# Patient Record
Sex: Female | Born: 1980 | Race: White | Hispanic: No | Smoking: Current every day smoker
Health system: Southern US, Community
[De-identification: ages and names within clinical notes are randomized; demographics above are authoritative.]

## PROBLEM LIST (undated history)

## (undated) DIAGNOSIS — F329 Major depressive disorder, single episode, unspecified: Secondary | ICD-10-CM

## (undated) DIAGNOSIS — F909 Attention-deficit hyperactivity disorder, unspecified type: Secondary | ICD-10-CM

## (undated) DIAGNOSIS — F419 Anxiety disorder, unspecified: Secondary | ICD-10-CM

## (undated) DIAGNOSIS — F4310 Post-traumatic stress disorder, unspecified: Secondary | ICD-10-CM

## (undated) DIAGNOSIS — G43909 Migraine, unspecified, not intractable, without status migrainosus: Secondary | ICD-10-CM

## (undated) DIAGNOSIS — F32A Depression, unspecified: Secondary | ICD-10-CM

## (undated) HISTORY — DX: Attention-deficit hyperactivity disorder, unspecified type: F90.9

## (undated) HISTORY — DX: Post-traumatic stress disorder, unspecified: F43.10

## (undated) HISTORY — DX: Major depressive disorder, single episode, unspecified: F32.9

## (undated) HISTORY — DX: Anxiety disorder, unspecified: F41.9

## (undated) HISTORY — DX: Depression, unspecified: F32.A

## (undated) HISTORY — DX: Migraine, unspecified, not intractable, without status migrainosus: G43.909

---
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
## 2007-09-25 ENCOUNTER — Ambulatory Visit: Payer: Self-pay | Admitting: Family Medicine

## 2007-09-25 ENCOUNTER — Other Ambulatory Visit: Admission: RE | Admit: 2007-09-25 | Discharge: 2007-09-25 | Payer: Self-pay | Admitting: Family Medicine

## 2007-09-25 ENCOUNTER — Encounter: Payer: Self-pay | Admitting: Family Medicine

## 2007-09-25 DIAGNOSIS — D509 Iron deficiency anemia, unspecified: Secondary | ICD-10-CM

## 2007-09-25 HISTORY — DX: Iron deficiency anemia, unspecified: D50.9

## 2007-09-30 LAB — CONVERTED CEMR LAB: Pap Smear: NORMAL

## 2011-05-14 DIAGNOSIS — F319 Bipolar disorder, unspecified: Secondary | ICD-10-CM

## 2011-05-14 HISTORY — DX: Bipolar disorder, unspecified: F31.9

## 2018-09-12 DIAGNOSIS — H5213 Myopia, bilateral: Secondary | ICD-10-CM | POA: Diagnosis not present

## 2018-10-16 DIAGNOSIS — Z1151 Encounter for screening for human papillomavirus (HPV): Secondary | ICD-10-CM | POA: Diagnosis not present

## 2018-10-16 DIAGNOSIS — Z113 Encounter for screening for infections with a predominantly sexual mode of transmission: Secondary | ICD-10-CM | POA: Diagnosis not present

## 2018-10-16 DIAGNOSIS — Z130 Encounter for screening for diseases of the blood and blood-forming organs and certain disorders involving the immune mechanism: Secondary | ICD-10-CM | POA: Diagnosis not present

## 2018-10-16 DIAGNOSIS — Z01419 Encounter for gynecological examination (general) (routine) without abnormal findings: Secondary | ICD-10-CM | POA: Diagnosis not present

## 2018-10-16 DIAGNOSIS — N898 Other specified noninflammatory disorders of vagina: Secondary | ICD-10-CM | POA: Diagnosis not present

## 2018-10-16 DIAGNOSIS — Z124 Encounter for screening for malignant neoplasm of cervix: Secondary | ICD-10-CM | POA: Diagnosis not present

## 2018-12-17 DIAGNOSIS — F902 Attention-deficit hyperactivity disorder, combined type: Secondary | ICD-10-CM | POA: Diagnosis not present

## 2018-12-17 DIAGNOSIS — F411 Generalized anxiety disorder: Secondary | ICD-10-CM | POA: Diagnosis not present

## 2018-12-17 DIAGNOSIS — F4310 Post-traumatic stress disorder, unspecified: Secondary | ICD-10-CM | POA: Diagnosis not present

## 2018-12-17 DIAGNOSIS — F319 Bipolar disorder, unspecified: Secondary | ICD-10-CM | POA: Diagnosis not present

## 2018-12-22 ENCOUNTER — Encounter: Payer: Self-pay | Admitting: Family Medicine

## 2018-12-22 ENCOUNTER — Ambulatory Visit (INDEPENDENT_AMBULATORY_CARE_PROVIDER_SITE_OTHER): Payer: BLUE CROSS/BLUE SHIELD | Admitting: Family Medicine

## 2018-12-22 VITALS — BP 108/60 | HR 89 | Temp 98.8°F | Resp 16 | Ht 62.0 in | Wt 119.4 lb

## 2018-12-22 DIAGNOSIS — R3915 Urgency of urination: Secondary | ICD-10-CM

## 2018-12-22 DIAGNOSIS — N3090 Cystitis, unspecified without hematuria: Secondary | ICD-10-CM

## 2018-12-22 HISTORY — DX: Cystitis, unspecified without hematuria: N30.90

## 2018-12-22 LAB — POCT URINALYSIS DIPSTICK
Bilirubin, UA: NEGATIVE
Glucose, UA: NEGATIVE
Ketones, UA: NEGATIVE
Nitrite, UA: NEGATIVE
PH UA: 6 (ref 5.0–8.0)
Protein, UA: NEGATIVE
Spec Grav, UA: 1.015 (ref 1.010–1.025)
UROBILINOGEN UA: 0.2 U/dL

## 2018-12-22 MED ORDER — NITROFURANTOIN MONOHYD MACRO 100 MG PO CAPS: 100.0000 mg | ORAL_CAPSULE | Freq: Two times a day (BID) | ORAL | 0 refills | Status: DC

## 2018-12-22 NOTE — Assessment & Plan Note (Signed)
-  Will cover empirically with macrobid -Increase fluids -Urine sent for culture and will update antibiotics if needed based on results.

## 2018-12-22 NOTE — Patient Instructions (Signed)
Urinary Tract Infection, Adult A urinary tract infection (UTI) is an infection of any part of the urinary tract. The urinary tract includes:  The kidneys.  The ureters.  The bladder.  The urethra. These organs make, store, and get rid of pee (urine) in the body. What are the causes? This is caused by germs (bacteria) in your genital area. These germs grow and cause swelling (inflammation) of your urinary tract. What increases the risk? You are more likely to develop this condition if:  You have a small, thin tube (catheter) to drain pee.  You cannot control when you pee or poop (incontinence).  You are female, and: ? You use these methods to prevent pregnancy: ? A medicine that kills sperm (spermicide). ? A device that blocks sperm (diaphragm). ? You have low levels of a female hormone (estrogen). ? You are pregnant.  You have genes that add to your risk.  You are sexually active.  You take antibiotic medicines.  You have trouble peeing because of: ? A prostate that is bigger than normal, if you are female. ? A blockage in the part of your body that drains pee from the bladder (urethra). ? A kidney stone. ? A nerve condition that affects your bladder (neurogenic bladder). ? Not getting enough to drink. ? Not peeing often enough.  You have other conditions, such as: ? Diabetes. ? A weak disease-fighting system (immune system). ? Sickle cell disease. ? Gout. ? Injury of the spine. What are the signs or symptoms? Symptoms of this condition include:  Needing to pee right away (urgently).  Peeing often.  Peeing small amounts often.  Pain or burning when peeing.  Blood in the pee.  Pee that smells bad or not like normal.  Trouble peeing.  Pee that is cloudy.  Fluid coming from the vagina, if you are female.  Pain in the belly or lower back. Other symptoms include:  Throwing up (vomiting).  No urge to eat.  Feeling mixed up (confused).  Being tired  and grouchy (irritable).  A fever.  Watery poop (diarrhea). How is this treated? This condition may be treated with:  Antibiotic medicine.  Other medicines.  Drinking enough water. Follow these instructions at home:  Medicines  Take over-the-counter and prescription medicines only as told by your doctor.  If you were prescribed an antibiotic medicine, take it as told by your doctor. Do not stop taking it even if you start to feel better. General instructions  Make sure you: ? Pee until your bladder is empty. ? Do not hold pee for a long time. ? Empty your bladder after sex. ? Wipe from front to back after pooping if you are a female. Use each tissue one time when you wipe.  Drink enough fluid to keep your pee pale yellow.  Keep all follow-up visits as told by your doctor. This is important. Contact a doctor if:  You do not get better after 1-2 days.  Your symptoms go away and then come back. Get help right away if:  You have very bad back pain.  You have very bad pain in your lower belly.  You have a fever.  You are sick to your stomach (nauseous).  You are throwing up. Summary  A urinary tract infection (UTI) is an infection of any part of the urinary tract.  This condition is caused by germs in your genital area.  There are many risk factors for a UTI. These include having a small, thin   tube to drain pee and not being able to control when you pee or poop.  Treatment includes antibiotic medicines for germs.  Drink enough fluid to keep your pee pale yellow. This information is not intended to replace advice given to you by your health care provider. Make sure you discuss any questions you have with your health care provider. Document Released: 03/18/2008 Document Revised: 04/09/2018 Document Reviewed: 04/09/2018 Elsevier Interactive Patient Education  2019 Elsevier Inc[employer]  

## 2018-12-22 NOTE — Progress Notes (Signed)
Vicki Erickson - 38 y.o. female MRN [id number]  Date of birth: 01-29-1981  Subjective Chief Complaint  Patient presents with  . Urinary Urgency    with tingling feeling, abdominal discomfort, x Sx started saturday  Denies dysuria,hematuria, back pain    HPI  Vicki Erickson is a 38 y.o. female who complains of urinary frequency, urgency and tingling sensation with urination x 3 days, without flank pain, fever, chills, or abnormal vaginal discharge or bleeding.  She has not tried anything for treatment so far.  No LMP recorded. (Menstrual status: Oral contraceptives).   ROS:  A comprehensive ROS was completed and negative except as noted per HPI   Allergies  Allergen Reactions  . Other Hives    Mint (leave)  . Penicillins Swelling    Past Medical History:  Diagnosis Date  . ADHD   . Anxiety   . PTSD (post-traumatic stress disorder)     History reviewed. No pertinent surgical history.  Social History   Socioeconomic History  . Marital status: Legally Separated    Spouse name: Not on file  . Number of children: Not on file  . Years of education: Not on file  . Highest education level: Not on file  Occupational History  . Not on file  Social Needs  . Financial resource strain: Not on file  . Food insecurity:    Worry: Not on file    Inability: Not on file  . Transportation needs:    Medical: Not on file    Non-medical: Not on file  Tobacco Use  . Smoking status: Current Every Day Smoker    Packs/day: 0.50    Types: Cigarettes  . Smokeless tobacco: Never Used  Substance and Sexual Activity  . Alcohol use: Yes    Comment: 1-2 day beer a day  . Drug use: Yes    Types: Marijuana  . Sexual activity: Not on file  Lifestyle  . Physical activity:    Days per week: Not on file    Minutes per session: Not on file  . Stress: Not on file  Relationships  . Social connections:    Talks on phone: Not on file    Gets together: Not on file    Attends religious  service: Not on file    Active member of club or organization: Not on file    Attends meetings of clubs or organizations: Not on file    Relationship status: Not on file  Other Topics Concern  . Not on file  Social History Narrative  . Not on file    Family History  Problem Relation Age of Onset  . COPD Mother     Health Maintenance  Topic Date Due  . HIV Screening  11/25/1997  . TETANUS/TDAP  11/25/2001  . PAP SMEAR-Modifier  11/26/2003  . INFLUENZA VACCINE  05/14/2018    ----------------------------------------------------------------------------------------------------------------------------------------------------------------------------------------------------------------- Physical Exam BP 108/60 (BP Location: Right Arm, Patient Position: Sitting, Cuff Size: Normal)   Pulse 89   Temp 98.8 F (37.1 C) (Oral)   Resp 16   Ht 5\' 2"  (1.575 m)   Wt 119 lb 6.4 oz (54.2 kg)   SpO2 98%   BMI 21.84 kg/m   Physical Exam Constitutional:      Appearance: Normal appearance.  HENT:     Head: Normocephalic and atraumatic.  Eyes:     General: No scleral icterus. Neck:     Musculoskeletal: Neck supple.  Cardiovascular:     Rate and Rhythm: Normal rate  and regular rhythm.  Pulmonary:     Effort: Pulmonary effort is normal.     Breath sounds: Normal breath sounds.  Abdominal:     General: Abdomen is flat. There is no distension.     Tenderness: There is no abdominal tenderness. There is no right CVA tenderness or left CVA tenderness.  Skin:    General: Skin is warm and dry.     Findings: No rash.  Neurological:     General: No focal deficit present.     Mental Status: She is alert.  Psychiatric:        Mood and Affect: Mood normal.        Behavior: Behavior normal.      ------------------------------------------------------------------------------------------------------------------------------------------------------------------------------------------------------------------- Assessment and Plan  Cystitis -Will cover empirically with macrobid -Increase fluids -Urine sent for culture and will update antibiotics if needed based on results.

## 2018-12-24 ENCOUNTER — Other Ambulatory Visit: Payer: Self-pay | Admitting: Family Medicine

## 2018-12-24 LAB — URINE CULTURE
MICRO NUMBER:: 299701
SPECIMEN QUALITY:: ADEQUATE

## 2018-12-24 MED ORDER — SULFAMETHOXAZOLE-TRIMETHOPRIM 800-160 MG PO TABS: 1.0000 | ORAL_TABLET | Freq: Two times a day (BID) | ORAL | 0 refills | Status: AC

## 2018-12-24 NOTE — Progress Notes (Signed)
Urine pathogen shows partial resistance to prescribed antibiotic (nitrofurantoin).  I have sent a new medication (bactrim) over for her to start in place of this.

## 2019-01-14 ENCOUNTER — Telehealth: Payer: Self-pay | Admitting: Family Medicine

## 2019-01-14 DIAGNOSIS — J309 Allergic rhinitis, unspecified: Secondary | ICD-10-CM | POA: Diagnosis not present

## 2019-01-14 DIAGNOSIS — R350 Frequency of micturition: Secondary | ICD-10-CM | POA: Diagnosis not present

## 2019-01-14 DIAGNOSIS — R21 Rash and other nonspecific skin eruption: Secondary | ICD-10-CM | POA: Diagnosis not present

## 2019-01-14 NOTE — Telephone Encounter (Signed)
Duplicate

## 2019-01-14 NOTE — Telephone Encounter (Signed)
Copied from [place] [id number]. Topic: Appointment Scheduling >> Jan 14, 2019  2:05 PM Blase Mess A wrote: CRM for notification. See Telephone encounter for: 01/14/19. Patient is calling because she is have allergies.  She is having sore throat 2 days, and UTI symptoms with frequent urination.  She saw Dr. Zigmund Daniel on 12/22/18 for UTI symptoms.  Office contacted. No answer. Telephone number verified.CB- [phone] (M)

## 2019-01-14 NOTE — Telephone Encounter (Signed)
Copied from [place] [id number]. Topic: Appointment Scheduling >> Jan 14, 2019  2:05 PM Blase Mess A wrote: CRM for notification. See Telephone encounter for: 01/14/19. Patient is calling because she is have allergies.  She is having sore throat 2 days, and UTI symptoms with frequent urination.  She saw Dr. Zigmund Daniel on 12/22/18 for UTI symptoms.  Office contacted. No answer. Telephone number verified.CB- [phone] (M)

## 2019-01-14 NOTE — Telephone Encounter (Signed)
Doesn't sound like an allergic reaction however I would recommend a Webex to go over her symptoms.  I can order a UA and she can come in to lab to have this completed.

## 2019-01-14 NOTE — Telephone Encounter (Signed)
RCB - LAM for RCB to make appt.

## 2019-01-14 NOTE — Telephone Encounter (Signed)
Pt called back as she forgot to tell the agent another sx she has.  Pt is having small little bumps down her jaw line and the back of her neck. She has had them about 2 days.  She thinks it may be related to some chicken she heated up in a plastic bowl, the bowl melted onto the chicken, she she wonders if this is a allergic reaction to the plastic.Marland Kitchen  Pt states she also getting   Pt states she has sore throat, and drainage down the back of her throat, and she going to the bathroom often.  Now she says her glands may even be a bit sensitive, maybe even a little swollen.  Pt is able to face time. Although pt concerns she cannot be dx without being seen.  But pt would like a call back today,asap

## 2019-01-14 NOTE — Telephone Encounter (Signed)
Copied from [place] [phone]. Topic: Appointment Scheduling - Scheduling Inquiry for Clinic >> Jan 14, 2019  2:05 PM Blase Mess A wrote: CRM for notification. See Telephone encounter for: 01/14/19. Patient is calling because she is have allergies.  She is having sore throat 2 days, and UTI symptoms with frequent urination.  She saw Dr. Zigmund Daniel on 12/22/18 for UTI symptoms.  Office contacted. No answer. Telephone number verified.CB- [phone] (M)

## 2019-01-14 NOTE — Telephone Encounter (Signed)
If she prefers F2F I can see her on Monday or we can see if any of the other providers in the clinic would be willing to see her tomorrow.  Thanks!

## 2019-01-14 NOTE — Telephone Encounter (Signed)
Pt will call back .DID inform her of the Webex visits. Pt stated " how can I be treated without being physically seen?" Pt is self-pay as well. Pt was asked if she was having any anaphlyxisis Sy/Sx - Pt responded with no throat swelling.

## 2019-01-14 NOTE — Telephone Encounter (Signed)
Called Pt . LAM  

## 2019-03-15 DIAGNOSIS — F4310 Post-traumatic stress disorder, unspecified: Secondary | ICD-10-CM | POA: Diagnosis not present

## 2019-03-15 DIAGNOSIS — F902 Attention-deficit hyperactivity disorder, combined type: Secondary | ICD-10-CM | POA: Diagnosis not present

## 2019-03-15 DIAGNOSIS — F319 Bipolar disorder, unspecified: Secondary | ICD-10-CM | POA: Diagnosis not present

## 2019-03-15 DIAGNOSIS — F411 Generalized anxiety disorder: Secondary | ICD-10-CM | POA: Diagnosis not present

## 2019-04-06 DIAGNOSIS — N939 Abnormal uterine and vaginal bleeding, unspecified: Secondary | ICD-10-CM | POA: Diagnosis not present

## 2019-04-06 DIAGNOSIS — L259 Unspecified contact dermatitis, unspecified cause: Secondary | ICD-10-CM | POA: Diagnosis not present

## 2019-04-06 DIAGNOSIS — R0982 Postnasal drip: Secondary | ICD-10-CM | POA: Diagnosis not present

## 2019-05-21 DIAGNOSIS — N898 Other specified noninflammatory disorders of vagina: Secondary | ICD-10-CM | POA: Diagnosis not present

## 2019-05-21 DIAGNOSIS — R350 Frequency of micturition: Secondary | ICD-10-CM | POA: Diagnosis not present

## 2019-05-21 DIAGNOSIS — Z113 Encounter for screening for infections with a predominantly sexual mode of transmission: Secondary | ICD-10-CM | POA: Diagnosis not present

## 2019-05-21 DIAGNOSIS — N921 Excessive and frequent menstruation with irregular cycle: Secondary | ICD-10-CM | POA: Diagnosis not present

## 2019-06-14 DIAGNOSIS — Z23 Encounter for immunization: Secondary | ICD-10-CM | POA: Diagnosis not present

## 2019-07-19 DIAGNOSIS — J029 Acute pharyngitis, unspecified: Secondary | ICD-10-CM | POA: Diagnosis not present

## 2019-07-19 DIAGNOSIS — R05 Cough: Secondary | ICD-10-CM | POA: Diagnosis not present

## 2019-07-19 DIAGNOSIS — Z1159 Encounter for screening for other viral diseases: Secondary | ICD-10-CM | POA: Diagnosis not present

## 2019-08-03 ENCOUNTER — Telehealth: Payer: Self-pay | Admitting: *Deleted

## 2019-08-03 NOTE — Telephone Encounter (Signed)
Patient is calling with question about post nasal drip. Patient is having symptoms and has been seen by [facility] 5 times this year. Patient has been told she has moisture behind her ear. Patient states she has checked with her insurance and she does not need referral for appointment with specialist. Told patient if she does not want to go to PCP for this - ENT would be good place to start.

## 2019-08-04 DIAGNOSIS — J342 Deviated nasal septum: Secondary | ICD-10-CM | POA: Insufficient documentation

## 2019-08-04 DIAGNOSIS — R0982 Postnasal drip: Secondary | ICD-10-CM | POA: Diagnosis not present

## 2019-08-04 DIAGNOSIS — R490 Dysphonia: Secondary | ICD-10-CM | POA: Diagnosis not present

## 2019-08-04 DIAGNOSIS — K219 Gastro-esophageal reflux disease without esophagitis: Secondary | ICD-10-CM | POA: Diagnosis not present

## 2019-08-16 DIAGNOSIS — Z23 Encounter for immunization: Secondary | ICD-10-CM | POA: Diagnosis not present

## 2019-09-16 ENCOUNTER — Other Ambulatory Visit: Payer: Self-pay

## 2019-09-17 ENCOUNTER — Ambulatory Visit (INDEPENDENT_AMBULATORY_CARE_PROVIDER_SITE_OTHER): Payer: BC Managed Care – PPO | Admitting: Family Medicine

## 2019-09-17 ENCOUNTER — Encounter: Payer: Self-pay | Admitting: Family Medicine

## 2019-09-17 VITALS — Temp 98.1°F | Wt 116.0 lb

## 2019-09-17 DIAGNOSIS — I959 Hypotension, unspecified: Secondary | ICD-10-CM | POA: Insufficient documentation

## 2019-09-17 DIAGNOSIS — R6882 Decreased libido: Secondary | ICD-10-CM

## 2019-09-17 HISTORY — DX: Hypotension, unspecified: I95.9

## 2019-09-17 HISTORY — DX: Decreased libido: R68.82

## 2019-09-17 LAB — BASIC METABOLIC PANEL
BUN: 10 mg/dL (ref 6–23)
CO2: 27 mEq/L (ref 19–32)
Calcium: 9.5 mg/dL (ref 8.4–10.5)
Chloride: 102 mEq/L (ref 96–112)
Creatinine, Ser: 0.95 mg/dL (ref 0.40–1.20)
GFR: 66.26 mL/min (ref >60.00)
Glucose, Bld: 108 mg/dL — ABNORMAL HIGH (ref 70–99)
Potassium: 4.4 mEq/L (ref 3.5–5.1)
Sodium: 137 mEq/L (ref 135–145)

## 2019-09-17 LAB — TSH: TSH: 1.16 u[IU]/mL (ref 0.35–4.50)

## 2019-09-17 LAB — CORTISOL: Cortisol, Plasma: 20.8 ug/dL

## 2019-09-17 NOTE — Patient Instructions (Signed)
Great to see you today! We'll give you a call with lab results.  Some of your medications may be contributing to your symptoms.  If labs look ok, I would recommend working with your psychiatrist to see if change in medication may help with this.

## 2019-09-17 NOTE — Progress Notes (Signed)
Vicki Erickson - 38 y.o. female MRN [id number]  Date of birth: Feb 08, 1981  Subjective Chief Complaint  Patient presents with  . Hypotension    Patient is here today C/O low BP. She was advised by 2 different doctors that she had low-BP.  She has had dizzy spells all her life. 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   . Decreased Libido    She is also C/O low libido. She states that she has always had an issue with her libido. She does have an appointment with a GYN but wanted to address the issue here if possible. This is has a problem getting wet so started having to use lubrication or she gets tears. When having intercourse her mind races.    HPI Vicki Erickson is a 38 y.o. female with history of bipolar 1 d/o here today with complain to low blood pressure and decreased libido.   Low BP:  Reports she has had low BP readings for several years.  She was not sure of the symptoms of low blood pressure until recently but reports that she has had feelings of dizziness and lightheadedness on several occasions.  She had syncopal episode around 10 years ago.  She denies chest pain, palpitations, shortness of breath.  She is taking fluoxetine and zyprexa.   Decreased libido:  Reports decreased libido for several years as well.  Has difficulty with interest, arousal and orgasm.  Has to use artificial lubricant due to vaginal dryness.  She has appt with GYN to address this as well.  She feels like mental health issues are well controlled with current medications.  She has had some issues with memory loss and is planning on seeing neurology for this.   ROS:  A comprehensive ROS was completed and negative except as noted per HPI  Allergies  Allergen Reactions  . Other Hives    Mint (leave)  . Penicillins Swelling    Past Medical History:  Diagnosis Date  . ADHD   . Anxiety   . Depression   . PTSD (post-traumatic stress disorder)     No past surgical history on  file.  Social History   Socioeconomic History  . Marital status: Legally Separated    Spouse name: Not on file  . Number of children: Not on file  . Years of education: Not on file  . Highest education level: Not on file  Occupational History  . Not on file  Social Needs  . Financial resource strain: Not on file  . Food insecurity    Worry: Not on file    Inability: Not on file  . Transportation needs    Medical: Not on file    Non-medical: Not on file  Tobacco Use  . Smoking status: Current Every Day Smoker    Packs/day: 0.50    Types: Cigarettes  . Smokeless tobacco: Never Used  Substance and Sexual Activity  . Alcohol use: Yes    Comment: 1-2 day beer a day  . Drug use: Yes    Types: Marijuana  . Sexual activity: Not on file  Lifestyle  . Physical activity    Days per week: Not on file    Minutes per session: Not on file  . Stress: Not on file  Relationships  . Social Herbalist on phone: Not on file    Gets together: Not on file    Attends religious service: Not on file  Active member of club or organization: Not on file    Attends meetings of clubs or organizations: Not on file    Relationship status: Not on file  Other Topics Concern  . Not on file  Social History Narrative  . Not on file    Family History  Problem Relation Age of Onset  . COPD Mother     Health Maintenance  Topic Date Due  . HIV Screening  11/25/1997  . PAP SMEAR-Modifier  11/26/2003  . TETANUS/TDAP  05/15/2017  . INFLUENZA VACCINE  05/15/2019    ----------------------------------------------------------------------------------------------------------------------------------------------------------------------------------------------------------------- Physical Exam Temp 98.1 F (36.7 C) (Oral)   Wt 116 lb (52.6 kg)   LMP 08/18/2019   SpO2 96%   BMI 21.22 kg/m   Physical Exam Constitutional:      Appearance: Normal appearance.  HENT:     Head:  Normocephalic and atraumatic.  Eyes:     General: No scleral icterus. Neck:     Musculoskeletal: Neck supple.  Cardiovascular:     Rate and Rhythm: Normal rate and regular rhythm.  Pulmonary:     Effort: Pulmonary effort is normal.     Breath sounds: Normal breath sounds.  Skin:    General: Skin is warm and dry.  Neurological:     General: No focal deficit present.     Mental Status: She is alert.  Psychiatric:        Mood and Affect: Mood normal.        Behavior: Behavior normal.     ------------------------------------------------------------------------------------------------------------------------------------------------------------------------------------------------------------------- Assessment and Plan  Hypotension BP is non-orthostatic.  I discussed with her that her hypotension could come from her medications including SSRI and Zyprexa.   Will check cortisol, TSH and BMP today given her complaint of decreased libido as well.   If labs are normal I would recommend she talk with her psychiatrist about her medications to see if there may be something with less side effects that may be appropriate for her.   Decreased libido Possibly related to current medications.  Orders Placed This Encounter  Procedures  . TSH  . Cortisol  . Basic Metabolic Panel (BMET)   [id number] minutes spent with patient with 50% of time spent providing counseling and/or coordination of care.  This visit occurred during the SARS-CoV-2 public health emergency.  Safety protocols were in place, including screening questions prior to the visit, additional usage of staff PPE, and extensive cleaning of exam room while observing appropriate contact time as indicated for disinfecting solutions.

## 2019-09-17 NOTE — Assessment & Plan Note (Signed)
Possibly related to current medications.  Orders Placed This Encounter  Procedures  . TSH  . Cortisol  . Basic Metabolic Panel (BMET)

## 2019-09-17 NOTE — Assessment & Plan Note (Addendum)
BP is non-orthostatic.  I discussed with her that her hypotension could come from her medications including SSRI and Zyprexa.   Will check cortisol, TSH and BMP today given her complaint of decreased libido as well.   If labs are normal I would recommend she talk with her psychiatrist about her medications to see if there may be something with less side effects that may be appropriate for her.

## 2019-09-22 ENCOUNTER — Other Ambulatory Visit: Payer: Self-pay | Admitting: Family Medicine

## 2019-09-22 ENCOUNTER — Telehealth: Payer: Self-pay

## 2019-09-22 DIAGNOSIS — I959 Hypotension, unspecified: Secondary | ICD-10-CM

## 2019-09-22 NOTE — Telephone Encounter (Signed)
Dr. West Pugh sent result note. This was done closing this out

## 2019-09-22 NOTE — Telephone Encounter (Signed)
Copied from [place]. Topic: General - Inquiry >> Sep 22, 2019 12:25 PM Mathis Bud wrote: Reason for CRM: Patient called regarding her lab results  Call back [phone]

## 2019-10-04 DIAGNOSIS — H5213 Myopia, bilateral: Secondary | ICD-10-CM | POA: Diagnosis not present

## 2019-10-25 DIAGNOSIS — Z01419 Encounter for gynecological examination (general) (routine) without abnormal findings: Secondary | ICD-10-CM | POA: Diagnosis not present

## 2019-10-25 DIAGNOSIS — Z130 Encounter for screening for diseases of the blood and blood-forming organs and certain disorders involving the immune mechanism: Secondary | ICD-10-CM | POA: Diagnosis not present

## 2019-10-25 DIAGNOSIS — Z1389 Encounter for screening for other disorder: Secondary | ICD-10-CM | POA: Diagnosis not present

## 2019-10-25 DIAGNOSIS — Z8742 Personal history of other diseases of the female genital tract: Secondary | ICD-10-CM | POA: Diagnosis not present

## 2019-10-25 DIAGNOSIS — Z113 Encounter for screening for infections with a predominantly sexual mode of transmission: Secondary | ICD-10-CM | POA: Diagnosis not present

## 2019-10-26 ENCOUNTER — Telehealth: Payer: Self-pay | Admitting: Family Medicine

## 2019-10-26 DIAGNOSIS — Z202 Contact with and (suspected) exposure to infections with a predominantly sexual mode of transmission: Secondary | ICD-10-CM | POA: Diagnosis not present

## 2019-10-26 DIAGNOSIS — Z1151 Encounter for screening for human papillomavirus (HPV): Secondary | ICD-10-CM | POA: Diagnosis not present

## 2019-10-26 NOTE — Telephone Encounter (Signed)
Patient called and wanted to see if something can be called in to the pharmacy for diarrhea. CB is [phone].

## 2019-10-26 NOTE — Telephone Encounter (Signed)
Patient scheduled for VV 1/13.

## 2019-10-27 ENCOUNTER — Telehealth (INDEPENDENT_AMBULATORY_CARE_PROVIDER_SITE_OTHER): Payer: BC Managed Care – PPO | Admitting: Family Medicine

## 2019-10-27 ENCOUNTER — Encounter: Payer: Self-pay | Admitting: Family Medicine

## 2019-10-27 VITALS — Ht 62.0 in | Wt 115.0 lb

## 2019-10-27 DIAGNOSIS — K219 Gastro-esophageal reflux disease without esophagitis: Secondary | ICD-10-CM

## 2019-10-27 DIAGNOSIS — K591 Functional diarrhea: Secondary | ICD-10-CM

## 2019-10-27 HISTORY — DX: Functional diarrhea: K59.1

## 2019-10-27 HISTORY — DX: Gastro-esophageal reflux disease without esophagitis: K21.9

## 2019-10-27 MED ORDER — PANTOPRAZOLE SODIUM 40 MG PO TBEC: 40.0000 mg | DELAYED_RELEASE_TABLET | Freq: Every day | ORAL | 3 refills | Status: DC

## 2019-10-27 MED ORDER — DIPHENOXYLATE-ATROPINE 2.5-0.025 MG PO TABS: 1.0000 | ORAL_TABLET | Freq: Four times a day (QID) | ORAL | 0 refills | Status: DC | PRN

## 2019-10-27 NOTE — Progress Notes (Signed)
Vicki Erickson - 39 y.o. Erickson MRN [id number]  Date of birth: 19-Dec-1980   This visit type was conducted due to national recommendations for restrictions regarding the COVID-19 Pandemic (e.g. social distancing).  This format is felt to be most appropriate for this patient at this time.  All issues noted in this document were discussed and addressed.  No physical exam was performed (except for noted visual exam findings with Video Visits).  I discussed the limitations of evaluation and management by telemedicine and the availability of in person appointments. The patient expressed understanding and agreed to proceed.  I connected with@ on 10/27/19 at  1:00 PM EST by a video enabled telemedicine application and verified that I am speaking with the correct person using two identifiers.  Present at visit: Vicki Nutting, DO Vicki Erickson   Patient Location: Home Vicki Erickson   Provider location:   [place]  Chief Complaint  Patient presents with  . Diarrhea    c/o diarrhea becoming worse and she have had some incontinence episodes x 2 years, pt would like to know if you call in antiacides for her? She sees ENT for sinus issues and is currently waiting for them to give her a call.    HPI  Vicki Erickson who presents via audio/video conferencing for a telehealth visit today.  She has complaint of recurrent episodes of diarrhea.  She reports that she has had this for about the past 2 years.  Stool varies from soft to watery.  She denies issues with constipation.  She denies blood in her stool but she does get some blood with wiping due to irritation.  She has had some stool incontinence due to this.  She denies family history of IBD but she thinks her mother may have IBS.  She has tried immodium at times with some improvement.  She also has history of GERD, currently taking OTC omeprazole but getting breakthrough  symptoms.  Has chronic nausea and has phenergan for this.  Denies recent worsening.      ROS:  A comprehensive ROS was completed and negative except as noted per HPI  Past Medical History:  Diagnosis Date  . ADHD   . Anxiety   . Depression   . PTSD (post-traumatic stress disorder)     No past surgical history on file.  Family History  Problem Relation Age of Onset  . COPD Mother     Social History   Socioeconomic History  . Marital status: Legally Separated    Spouse name: Not on file  . Number of children: Not on file  . Years of education: Not on file  . Highest education level: Not on file  Occupational History  . Not on file  Tobacco Use  . Smoking status: Current Every Day Smoker    Packs/day: 0.50    Types: Cigarettes  . Smokeless tobacco: Never Used  Substance and Sexual Activity  . Alcohol use: Yes    Comment: 1-2 day beer a day  . Drug use: Yes    Types: Marijuana  . Sexual activity: Not on file  Other Topics Concern  . Not on file  Social History Narrative  . Not on file   Social Determinants of Health   Financial Resource Strain:   . Difficulty of Paying Living Expenses: Not on file  Food Insecurity:   . Worried About Charity fundraiser in the Last Year:  Not on file  . Ran Out of Food in the Last Year: Not on file  Transportation Needs:   . Lack of Transportation (Medical): Not on file  . Lack of Transportation (Non-Medical): Not on file  Physical Activity:   . Days of Exercise per Week: Not on file  . Minutes of Exercise per Session: Not on file  Stress:   . Feeling of Stress : Not on file  Social Connections:   . Frequency of Communication with Friends and Family: Not on file  . Frequency of Social Gatherings with Friends and Family: Not on file  . Attends Religious Services: Not on file  . Active Member of Clubs or Organizations: Not on file  . Attends Archivist Meetings: Not on file  . Marital Status: Not on file  Intimate  Partner Violence:   . Fear of Current or Ex-Partner: Not on file  . Emotionally Abused: Not on file  . Physically Abused: Not on file  . Sexually Abused: Not on file     Current Outpatient Medications:  .  amphetamine-dextroamphetamine (ADDERALL) 10 MG tablet, Take 1 tablet by mouth 2 (two) times daily., Disp: , Rfl:  .  busPIRone (BUSPAR) 7.5 MG tablet, Take 1 tablet by mouth 2 (two) times daily., Disp: , Rfl:  .  FLUoxetine (PROZAC) 20 MG capsule, TAKE 1 CAPSULE BY MOUTH IN THE MORNING FOR ANXIETY, Disp: , Rfl:  .  lamoTRIgine (LAMICTAL) 25 MG tablet, TAKE 1 TABLET BY MOUTH ONCE DAILY FOR MOOD, Disp: , Rfl:  .  SPRINTEC 28 0.25-35 MG-MCG tablet, daily., Disp: , Rfl:  .  diphenoxylate-atropine (LOMOTIL) 2.5-0.025 MG tablet, Take 1-2 tablets by mouth 4 (four) times daily as needed for diarrhea or loose stools., Disp: 30 tablet, Rfl: 0 .  OLANZapine (ZYPREXA) 5 MG tablet, TAKE 1 TABLET BY MOUTH IN THE EVENING AFTER SUPPER, Disp: , Rfl:  .  pantoprazole (PROTONIX) 40 MG tablet, Take 1 tablet (40 mg total) by mouth daily., Disp: 30 tablet, Rfl: 3 .  promethazine (PHENERGAN) 25 MG tablet, TAKE 1 TABLET BY MOUTH EVERY 6 HOURS AS NEEDED FOR NAUSEA, Disp: , Rfl:   EXAM:  VITALS per patient if applicable: Ht 5\' 2"  (1.575 m)   Wt 115 lb (52.2 kg) Comment: per pt  BMI 21.03 kg/m   GENERAL: alert, oriented, appears well and in no acute distress  HEENT: atraumatic, conjunttiva clear, no obvious abnormalities on inspection of external nose and ears  NECK: normal movements of the head and neck  LUNGS: on inspection no signs of respiratory distress, breathing rate appears normal, no obvious gross SOB, gasping or wheezing  CV: no obvious cyanosis  MS: moves all visible extremities without noticeable abnormality  PSYCH/NEURO: pleasant and cooperative, no obvious depression or anxiety, speech and thought processing grossly intact  ASSESSMENT AND PLAN:  Discussed the following assessment and  plan:  Functional diarrhea Diarrhea seems likely related to IBS.  Rx for lomotil as needed for diarrhea.  Referral to GI.   GERD (gastroesophageal reflux disease) Start protonix in place of omeprazole.  Discussed smoking cessation.  Having some nausea which is chronic.  May need endoscopy given continue breakthrough symptoms with nausea.      I discussed the assessment and treatment plan with the patient. The patient was provided an opportunity to ask questions and all were answered. The patient agreed with the plan and demonstrated an understanding of the instructions.   The patient was advised to call back or  seek an in-person evaluation if the symptoms worsen or if the condition fails to improve as anticipated.    Vicki Nutting, DO

## 2019-10-27 NOTE — Assessment & Plan Note (Signed)
Diarrhea seems likely related to IBS.  Rx for lomotil as needed for diarrhea.  Referral to GI.

## 2019-10-27 NOTE — Assessment & Plan Note (Addendum)
Start protonix in place of omeprazole.  Discussed smoking cessation.  Having some nausea which is chronic.  May need endoscopy given continue breakthrough symptoms with nausea.

## 2019-10-28 ENCOUNTER — Encounter: Payer: Self-pay | Admitting: Gastroenterology

## 2019-11-01 DIAGNOSIS — R42 Dizziness and giddiness: Secondary | ICD-10-CM | POA: Insufficient documentation

## 2019-11-01 DIAGNOSIS — Z7189 Other specified counseling: Secondary | ICD-10-CM | POA: Insufficient documentation

## 2019-11-01 NOTE — Progress Notes (Signed)
Cardiology Office Note   Date:  11/02/2019   ID:  Vicki Erickson, DOB 18-Mar-1981, MRN [id number]  PCP:  [name], DO  Cardiologist:   No primary care provider on file. Referring:  [name], DO  Chief Complaint  Patient presents with  . Dizziness      History of Present Illness: Vicki Erickson is a 39 y.o. female who presents for evaluation of dizzy spells and low blood pressure.  The patient has had dizziness for a long time.  She says she has had low blood pressure her life.  She does not describe orthostatic symptoms except rarely.  However, she had syncope 10 years ago.  She has occasional dizzy episodes.  She will feel like her balance is off.  Her vision will go gray and she will lose her peripheral vision.  It will start to look like a tunnel.  She might have to squat down or sit down.  These seem to last for several seconds before she recovers.  She feels her heart racing occasionally but not at the same time she is having palpitations.  She is able to do activities such as housework and carrying things.  She feels like walking on level ground in the parking lot or doing other activities is causing more fatigue and she has decreased exercise tolerance with this activity.  She is not had any prior cardiac work-up.  She is not having any chest pressure, neck or arm discomfort.  She did have thyroid studies and a normal cortisol.  However, because of the low blood pressures and dizziness she was referred for further evaluation.   Past Medical History:  Diagnosis Date  . ADHD   . ANEMIA, IRON DEFICIENCY 09/25/2007   Qualifier: Diagnosis of  By: Wynetta Emery LPN, Kim    . Anxiety   . Cystitis 12/22/2018  . Decreased libido 09/17/2019  . Depression   . 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 GERD (gastroesophageal reflux disease) 10/27/2019  . Hypotension 09/17/2019  . PTSD (post-traumatic stress disorder)     History reviewed. No pertinent surgical history.   Current  Outpatient Medications  Medication Sig Dispense Refill  . amphetamine-dextroamphetamine (ADDERALL) 10 MG tablet Take 1 tablet by mouth 2 (two) times daily.    . busPIRone (BUSPAR) 7.5 MG tablet Take 1 tablet by mouth 2 (two) times daily.    . diphenoxylate-atropine (LOMOTIL) 2.5-0.025 MG tablet Take 1-2 tablets by mouth 4 (four) times daily as needed for diarrhea or loose stools. 30 tablet 0  . FLUoxetine (PROZAC) 20 MG capsule 10 mg.     . lamoTRIgine (LAMICTAL) 25 MG tablet TAKE 1 TABLET BY MOUTH ONCE DAILY FOR MOOD    . OLANZapine (ZYPREXA) 5 MG tablet TAKE 1 TABLET BY MOUTH IN THE EVENING AFTER SUPPER    . pantoprazole (PROTONIX) 40 MG tablet Take 1 tablet (40 mg total) by mouth daily. 30 tablet 3  . promethazine (PHENERGAN) 25 MG tablet TAKE 1 TABLET BY MOUTH EVERY 6 HOURS AS NEEDED FOR NAUSEA    . SPRINTEC 28 0.25-35 MG-MCG tablet daily.     No current facility-administered medications for this visit.    Allergies:   Other, Penicillins, and Penicillins    Social History:  The patient  reports that she has been smoking cigarettes. She has been smoking about 0.50 packs per day. She has never used smokeless tobacco. She reports current alcohol use. She reports current drug use. Drug: Marijuana.   Family History:  The patient's family history includes COPD in her mother.    ROS:  Please see the history of present illness.   Otherwise, review of systems are positive for none.   All other systems are reviewed and negative.    PHYSICAL EXAM: VS:  BP 118/80 (BP Location: Right Arm, Patient Position: Standing, Cuff Size: Normal)   Pulse 74   Temp (!) 97.5 F (36.4 C)   Ht 5\' 2"  (1.575 m)   Wt 113 lb 9.6 oz (51.5 kg)   SpO2 99%   BMI 20.78 kg/m  , BMI Body mass index is 20.78 kg/m. GENERAL:  Well appearing HEENT:  Pupils equal round and reactive, fundi not visualized, oral mucosa unremarkable NECK:  No jugular venous distention, waveform within normal limits, carotid upstroke brisk  and symmetric, no bruits, no thyromegaly LYMPHATICS:  No cervical, inguinal adenopathy LUNGS:  Clear to auscultation bilaterally BACK:  No CVA tenderness CHEST:  Unremarkable HEART:  PMI not displaced or sustained,S1 and S2 within normal limits, no S3, no S4, no clicks, no rubs, no murmurs ABD:  Flat, positive bowel sounds normal in frequency in pitch, no bruits, no rebound, no guarding, no midline pulsatile mass, no hepatomegaly, no splenomegaly EXT:  2 plus pulses throughout, no edema, no cyanosis no clubbing SKIN:  No rashes no nodules NEURO:  Cranial nerves II through XII grossly intact, motor grossly intact throughout PSYCH:  Cognitively intact, oriented to person place and time    EKG:  EKG is ordered today. The ekg ordered today demonstrates Sinus rhythm, rate 74, axis within normal limits, intervals within normal limits, no acute ST-T wave changes.    Recent Labs: 09/17/2019: BUN 10; Creatinine, Ser 0.95; Potassium 4.4; Sodium 137; TSH 1.16    Lipid Panel No results found for: CHOL, TRIG, HDL, CHOLHDL, VLDL, LDLCALC, LDLDIRECT    Wt Readings from Last 3 Encounters:  11/02/19 113 lb 9.6 oz (51.5 kg)  10/27/19 115 lb (52.2 kg)  09/17/19 116 lb (52.6 kg)      Other studies Reviewed: Additional studies/ records that were reviewed today include: Labs. Review of the above records demonstrates:  Please see elsewhere in the note.     ASSESSMENT AND PLAN:  DIZZINESS: She does have low blood pressures.  I suspect that her blood pressure is dropping further and she gets these episodes.  We talked about volume expansion and salt liberalization.  We talked about staying hydrated and recognizing the symptoms.  I mentioned compression stockings.  At this point I pursue these conservative measures and avoid any further testing other than below unless symptoms get worse.  DECREASED EXERCISE TOLERANCE: She does describe a decreased exercise tolerance with activities just walking short  to moderate distance on level ground.  I like to try to reproduce this with a POET (Plain Old Exercise Treadmill).  This can allow [place] to assess her heart rate and make sure that her blood pressure is a normal response with exercise.  COVID EDUCATION: We talked briefly about the vaccine.  Current medicines are reviewed at length with the patient today.  The patient does not have concerns regarding medicines.  The following changes have been made:  no change  Labs/ tests ordered today include:   Orders Placed This Encounter  Procedures  . CBC  . Exercise Tolerance Test  . EKG 12-Lead     Disposition:   FU with me as needed      Signed, Minus Breeding, MD  11/02/2019 3:15 PM  [facility]

## 2019-11-02 ENCOUNTER — Encounter: Payer: Self-pay | Admitting: Cardiology

## 2019-11-02 ENCOUNTER — Ambulatory Visit (INDEPENDENT_AMBULATORY_CARE_PROVIDER_SITE_OTHER): Payer: BC Managed Care – PPO | Admitting: Cardiology

## 2019-11-02 ENCOUNTER — Other Ambulatory Visit: Payer: Self-pay

## 2019-11-02 VITALS — BP 118/80 | HR 74 | Temp 97.5°F | Ht 62.0 in | Wt 113.6 lb

## 2019-11-02 DIAGNOSIS — Z7189 Other specified counseling: Secondary | ICD-10-CM | POA: Diagnosis not present

## 2019-11-02 DIAGNOSIS — R42 Dizziness and giddiness: Secondary | ICD-10-CM | POA: Diagnosis not present

## 2019-11-02 DIAGNOSIS — R0602 Shortness of breath: Secondary | ICD-10-CM

## 2019-11-02 NOTE — Patient Instructions (Signed)
Medication Instructions:  No Changes *If you need a refill on your cardiac medications before your next appointment, please call your pharmacy*  Lab Work: Your physician recommends that you return for lab work today (CBC) If you have labs (blood work) drawn today and your tests are completely normal, you will receive your results only by: Marland Kitchen MyChart Message (if you have MyChart) OR . A paper copy in the mail If you have any lab test that is abnormal or we need to change your treatment, we will call you to review the results.  Testing/Procedures: Your physician has requested that you have an exercise tolerance test. For further information please visit [email or web address]. Please also follow instruction sheet, as given. [place] will need a Covid Screening 3 days before your exercise tolerance test.This is a Drive Up Visit at the [employer] [address] [place]. Someone will direct you to the appropriate testing line. Stay in your car and someone will be with you shortly    Follow-Up: At [facility], you and your health needs are our priority.  As part of our continuing mission to provide you with exceptional heart care, we have created designated Provider Care Teams.  These Care Teams include your primary Cardiologist (physician) and Advanced Practice Providers (APPs -  Physician Assistants and Nurse Practitioners) who all work together to provide you with the care you need, when you need it.  Other Instructions Follow up as needed

## 2019-11-03 LAB — CBC
Hematocrit: 38.4 % (ref 34.0–46.6)
Hemoglobin: 12.7 g/dL (ref 11.1–15.9)
MCH: 32.1 pg (ref 26.6–33.0)
MCHC: 33.1 g/dL (ref 31.5–35.7)
MCV: 97 fL (ref 79–97)
Platelets: 430 10*3/uL (ref 150–450)
RBC: 3.96 x10E6/uL (ref 3.77–5.28)
RDW: 12.4 % (ref 11.7–15.4)
WBC: 8 10*3/uL (ref 3.4–10.8)

## 2019-11-05 ENCOUNTER — Telehealth: Payer: Self-pay | Admitting: Cardiology

## 2019-11-05 NOTE — Telephone Encounter (Signed)
Left message for patient to call and schedule ETT and COVID testing ordered by Dr. Percival Spanish

## 2019-11-10 NOTE — Telephone Encounter (Signed)
Left message for patient to call and schedule ETT and COVID testing ordered by Dr. Percival Spanish

## 2019-11-15 HISTORY — PX: COLONOSCOPY: SHX174

## 2019-11-15 HISTORY — PX: UPPER GI ENDOSCOPY: SHX6162

## 2019-11-16 ENCOUNTER — Encounter: Payer: Self-pay | Admitting: Gastroenterology

## 2019-11-16 ENCOUNTER — Ambulatory Visit (INDEPENDENT_AMBULATORY_CARE_PROVIDER_SITE_OTHER): Payer: BC Managed Care – PPO | Admitting: Gastroenterology

## 2019-11-16 ENCOUNTER — Other Ambulatory Visit: Payer: Self-pay

## 2019-11-16 VITALS — BP 104/66 | HR 93 | Temp 98.4°F | Ht 62.0 in | Wt 113.2 lb

## 2019-11-16 DIAGNOSIS — F17200 Nicotine dependence, unspecified, uncomplicated: Secondary | ICD-10-CM | POA: Diagnosis not present

## 2019-11-16 DIAGNOSIS — R197 Diarrhea, unspecified: Secondary | ICD-10-CM

## 2019-11-16 DIAGNOSIS — K219 Gastro-esophageal reflux disease without esophagitis: Secondary | ICD-10-CM | POA: Diagnosis not present

## 2019-11-16 DIAGNOSIS — Z01818 Encounter for other preprocedural examination: Secondary | ICD-10-CM

## 2019-11-16 DIAGNOSIS — Z800 Family history of malignant neoplasm of digestive organs: Secondary | ICD-10-CM

## 2019-11-16 MED ORDER — CLENPIQ 10-3.5-12 MG-GM -GM/160ML PO SOLN: 1.0000 | Freq: Once | ORAL | 0 refills | Status: AC

## 2019-11-16 NOTE — Progress Notes (Signed)
Chief Complaint: GERD, diarrhea, nausea  Referring Provider:     [name], DO   HPI:     Vicki Erickson is a 39 y.o. female with a history of anxiety, depression, ADHD, hypotension, tobacco use, referred to the Gastroenterology Clinic for evaluation of GERD and diarrhea.  GERD: Index symptoms of regurgitation, nausea, chronic cough, raspy voice, increased throat clearing. Nocturnal cough and choking sensation. No associated exacerbating foods. Has been taking antiemetics for 10-12 years, and was told she had reflux approx 17 years ago. Was seen by ENT and told this was reflux related sxs. Started taking OTC omeprazole but continued breakthrough symptoms.  Started on Protonix 40 mg/day 2 weeks ago by [facility] with some improvement, so went back to taking Prilosec OTC in addition on her own.  Also c/o thick mucus like sensation in esophagus, but not necessarily dysphagia. No odynophagia.   Diarrhea: Episodic watery, nonbloody stools for the last 2 years.  Occasional scant BRB on tissue paper.  Has had stool incontinence.  Some improvement with trial of Imodium prn.  Was recently started on Lomotil with resolution. But she would like to find etiology rather than ongoing med use. Consumes a good amount of dietary fiber. Has not trialed other meds or fiber supplement, etc. No preceding exposures, travel, Abx, etc.  Postnasal drainage: Postnasal drip symptoms x1.5-2 years.  Was evaluated by [facility]NT at [facility] in 07/2019.  Felt postnasal drainage was related to reflux advised Prilosec 20 mg bid and nasal hygiene instructions.  C/o pain hot lately.   No previous EGD or colonoscopy.  CBC in 10/2019 was normal.  BMP and TSH from 09/2019 normal.  No recent abdominal imaging for review today.  PGM: she thinks stomach CA and colon CA  No other known family history of CRC, GI malignancy, liver disease, pancreatic disease, or IBD.     Past Medical History:  Diagnosis Date  . ADHD    . ANEMIA, IRON DEFICIENCY 09/25/2007   Qualifier: Diagnosis of  By: Wynetta Emery LPN, Kim    . Anxiety   . Cystitis 12/22/2018  . Decreased libido 09/17/2019  . Depression   . 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 GERD (gastroesophageal reflux disease) 10/27/2019  . Hypotension 09/17/2019  . PTSD (post-traumatic stress disorder)      History reviewed. No pertinent surgical history. Family History  Problem Relation Age of Onset  . COPD Mother   . Irritable bowel syndrome Mother   . Other Mother        esophageal polyps  . Skin cancer Father   . Irritable bowel syndrome Father   . Other Maternal Grandfather        small cell  . Breast cancer Paternal Grandmother   . Stomach cancer Paternal Grandmother   . Colon cancer Paternal Grandmother   . Breast cancer Paternal Aunt        great aunt  . Ovarian cancer Maternal Aunt    Social History   Tobacco Use  . Smoking status: Current Every Day Smoker    Packs/day: 0.50    Types: Cigarettes  . Smokeless tobacco: Never Used  Substance Use Topics  . Alcohol use: Yes    Comment: 1-2 day beer a day  . Drug use: Yes    Types: Marijuana   Current Outpatient Medications  Medication Sig Dispense Refill  . amphetamine-dextroamphetamine (ADDERALL) 10 MG tablet Take 0.5 tablets by mouth as needed.     Marland Kitchen  busPIRone (BUSPAR) 7.5 MG tablet Take 1 tablet by mouth 2 (two) times daily.    . diphenoxylate-atropine (LOMOTIL) 2.5-0.025 MG tablet Take 1-2 tablets by mouth 4 (four) times daily as needed for diarrhea or loose stools. (Patient taking differently: Take 1 tablet by mouth daily. ) 30 tablet 0  . FLUoxetine (PROZAC) 20 MG capsule Take 20 mg by mouth daily.     Marland Kitchen lamoTRIgine (LAMICTAL) 25 MG tablet TAKE 1 TABLET BY MOUTH ONCE DAILY FOR MOOD    . omeprazole (PRILOSEC) 20 MG capsule Take 20 mg by mouth daily.    . pantoprazole (PROTONIX) 40 MG tablet Take 1 tablet (40 mg total) by mouth daily. 30 tablet 3  . promethazine (PHENERGAN) 25 MG tablet TAKE  1 TABLET BY MOUTH EVERY 6 HOURS AS NEEDED FOR NAUSEA    . SPRINTEC 28 0.25-35 MG-MCG tablet daily.    Marland Kitchen OLANZapine (ZYPREXA) 5 MG tablet TAKE 1 TABLET BY MOUTH IN THE EVENING AFTER SUPPER    . Sod Picosulfate-Mag Ox-Cit Acd (CLENPIQ) 10-3.5-12 MG-GM -GM/160ML SOLN Take 1 kit by mouth once for 1 dose. 320 mL 0   No current facility-administered medications for this visit.   Allergies  Allergen Reactions  . Other Hives    Mint (leave)  . Penicillins     REACTION: lips swell  . Penicillins Swelling     Review of Systems: All systems reviewed and negative except where noted in HPI.     Physical Exam:    Wt Readings from Last 3 Encounters:  11/16/19 113 lb 4 oz (51.4 kg)  11/02/19 113 lb 9.6 oz (51.5 kg)  10/27/19 115 lb (52.2 kg)    BP 104/66   Pulse 93   Temp 98.4 F (36.9 C)   Ht '5\' 2"'$  (1.575 m)   Wt 113 lb 4 oz (51.4 kg)   BMI 20.71 kg/m  Constitutional:  Pleasant, in no acute distress. Psychiatric: Normal mood and affect. Behavior is normal. EENT: Pupils normal.  Conjunctivae are normal. No scleral icterus. Neck supple. No cervical LAD. Cardiovascular: Normal rate, regular rhythm. No edema Pulmonary/chest: Effort normal and breath sounds normal. No wheezing, rales or rhonchi. Abdominal: Mild generalized abdominal discomfort, but no rebound or guarding.  No peritoneal signs.  Soft, nondistended. Bowel sounds active throughout. There are no masses palpable. No hepatomegaly. Neurological: Alert and oriented to person place and time. Skin: Skin is warm and dry. No rashes noted.   ASSESSMENT AND PLAN;   1) GERD: Agree the symptomatology highly suggestive of reflux, with features of both typical and atypical reflux particularly regurgitation, increased throat clearing, hoarseness/raspy voice.  Response to trial of PPI therapy also c/w GERD.  -Increasing Protonix to 40 mg bid should obviate the need for Prilosec OTC -EGD to evaluate for erosive esophagitis, LES laxity,  hiatal hernia -Avoid eating close to bedtime.  Consider sleeping with HOB elevated   2) Diarrhea -Check GI PCR panel and C. difficile -Colonoscopy with random and directed biopsies to evaluate for Microscopic Colitis, IBD, etc. -Duodenal biopsies at time of EGD as above -Increase dietary fiber.  Provided with fiber chart today -Depending on response to therapy and endoscopic findings, could also add fiber supplement -Okay to resume Lomotil for now -Normal TSH, CBC, BMP  3) Tobacco use disorder -One of your biggest health concerns is your smoking.  This increases your risk for most cancers and serious cardiovascular diseases such as strokes, heart attacks.  You should try your best to stop.  If you need assistance, please contact your PCP or Smoking Cessation Class at [facility] [phone]) or [place] (1-800-QUIT-NOW). -Counseled on risks of tobacco use and GI pathology, to include malignancy, reflux  4) Family history of GI malignancy -Reported history of PGM with stomach/colon CA.  Can certainly evaluate for malignancy screening at time of EGD and colonoscopy as outlined above  The indications, risks, and benefits of EGD and colonoscopy were explained to the patient in detail. Risks include but are not limited to bleeding, perforation, adverse reaction to medications, and cardiopulmonary compromise. Sequelae include but are not limited to the possibility of surgery, hositalization, and mortality. The patient verbalized understanding and wished to proceed. All questions answered, referred to scheduler and bowel prep ordered. Further recommendations pending results of the exam.    Lavena Bullion, DO, FACG  11/16/2019, 4:28 PM   [name], DO

## 2019-11-16 NOTE — Patient Instructions (Signed)
You have been scheduled for an endoscopy and colonoscopy. Please follow the written instructions given to you at your visit today. Please pick up your prep supplies at the pharmacy within the next 1-3 days. If you use inhalers (even only as needed), please bring them with you on the day of your procedure. Your physician has requested that you go to www.startemmi.com and enter the access code given to you at your visit today. This web site gives a general overview about your procedure. However, you should still follow specific instructions given to you by our office regarding your preparation for the procedure.  Your provider has requested that you go to the basement level for lab work at [facility] in [place] [address]. Press "B" on the elevator. The lab is located at the first door on the left as you exit the elevator.  Fiber Chart  You should be consuming 25-30g of fiber per day and drinking 8 glasses of water to help your bowels move regularly.  In the chart below you can look up how much fiber you are getting in an average day.  If you are not getting enough fiber, you should add a fiber supplement to your diet.  Examples of this include Metamucil, FiberCon and Citrucel.  These can be purchased at your local grocery store or pharmacy.      [email or web address]-guerrero.[email or web address]  It was a pleasure to see you today!  Vito Cirigliano, D.O.

## 2019-11-17 ENCOUNTER — Telehealth: Payer: Self-pay | Admitting: Gastroenterology

## 2019-11-17 DIAGNOSIS — N644 Mastodynia: Secondary | ICD-10-CM

## 2019-11-17 DIAGNOSIS — Z01812 Encounter for preprocedural laboratory examination: Secondary | ICD-10-CM

## 2019-11-17 NOTE — Telephone Encounter (Signed)
Pt reported that she has been experiencing breast tenderness, enlarged nipples, RUQ and back pain and headache.  She did not mention this in OV 11/16/19.  Please advise.

## 2019-11-17 NOTE — Telephone Encounter (Signed)
Please review and advise.

## 2019-11-18 NOTE — Telephone Encounter (Signed)
Left message for patient to call back to the office;  

## 2019-11-18 NOTE — Telephone Encounter (Signed)
Verbal order received from Dr. Bryan Lemma for patient to complete a blood pregnancy test prior to the procedure date;   Order placed in Epic for [facility] lab;   Called and spoke with patient-patient reports she just finished taking a home pregnancy test (NEGATIVE) but will come to the [facility] lab prior to/after her COVID screening on 11/19/2019 for blood hcg; patient advised to call back to the office should questions/concerns arise; Patient verbalized understanding of information/instructions;   Staff message sent to RN to check back on this prior to patient having procedure on 11/22/2019;

## 2019-11-18 NOTE — Telephone Encounter (Signed)
Patient returned call to the office-patient advised of MD recommendations and has agreed to contact her PCP for additional "unrelated" symptoms; patient also advised that RUQ pain would be addressed at time of procedure;  Patient verbalized understanding of information/instructions;   Patient advised to call back to the office at [phone] should questions/concerns arise;

## 2019-11-18 NOTE — Telephone Encounter (Signed)
Not sure of exact etiology and which symptoms are related or independent.  Can certainly evaluate RUQ pain at time of EGD/colonoscopy as already scheduled.  Would be reasonable to have pregnancy test done prior to endoscopic procedures.  Otherwise, recommend following up with her PCM to discuss her additional concerns.

## 2019-11-19 ENCOUNTER — Telehealth: Payer: Self-pay | Admitting: Gastroenterology

## 2019-11-19 ENCOUNTER — Other Ambulatory Visit: Payer: Self-pay

## 2019-11-19 ENCOUNTER — Ambulatory Visit (INDEPENDENT_AMBULATORY_CARE_PROVIDER_SITE_OTHER): Payer: BC Managed Care – PPO

## 2019-11-19 ENCOUNTER — Other Ambulatory Visit (INDEPENDENT_AMBULATORY_CARE_PROVIDER_SITE_OTHER): Payer: BC Managed Care – PPO

## 2019-11-19 DIAGNOSIS — N644 Mastodynia: Secondary | ICD-10-CM

## 2019-11-19 DIAGNOSIS — Z1159 Encounter for screening for other viral diseases: Secondary | ICD-10-CM

## 2019-11-19 DIAGNOSIS — Z01812 Encounter for preprocedural laboratory examination: Secondary | ICD-10-CM

## 2019-11-19 LAB — HCG, QUANTITATIVE, PREGNANCY: Quantitative HCG: <0.6 m[IU]/mL

## 2019-11-19 NOTE — Telephone Encounter (Signed)
HCG negative. OK to proceed with EGD/Colonoscopy as planned.

## 2019-11-19 NOTE — Telephone Encounter (Signed)
Patient returned call to the office- patient given information and also given MD recommendations; patient is agreeable to plan of care;  Patient advised to call back to the office at [phone] should questions/concerns arise;  Patient verbalized understanding of information/instructions;

## 2019-11-19 NOTE — Telephone Encounter (Signed)
Left message for patient to call back to the office;  

## 2019-11-19 NOTE — Telephone Encounter (Signed)
Called and spoke with patient-patient is requesting to know if she can still take her Lomotil while doing the prep-patient advised not to take the Lomotil until after the procedure, as it would be counterproductive; patient verbalized understanding of information/instructions; Patient advised to call back to the office at [phone] should questions/concerns arise;

## 2019-11-19 NOTE — Telephone Encounter (Addendum)
Please check on pregnancy test results for this patient as she is scheduled for egd/colon on 11/22/2019

## 2019-11-20 LAB — SARS CORONAVIRUS 2 (TAT 6-24 HRS): SARS Coronavirus 2: NEGATIVE

## 2019-11-22 ENCOUNTER — Other Ambulatory Visit: Payer: BC Managed Care – PPO

## 2019-11-22 DIAGNOSIS — R197 Diarrhea, unspecified: Secondary | ICD-10-CM

## 2019-11-22 DIAGNOSIS — K219 Gastro-esophageal reflux disease without esophagitis: Secondary | ICD-10-CM | POA: Diagnosis not present

## 2019-11-23 ENCOUNTER — Encounter: Payer: Self-pay | Admitting: Gastroenterology

## 2019-11-23 ENCOUNTER — Ambulatory Visit (AMBULATORY_SURGERY_CENTER): Payer: BC Managed Care – PPO | Admitting: Gastroenterology

## 2019-11-23 ENCOUNTER — Other Ambulatory Visit: Payer: Self-pay

## 2019-11-23 VITALS — BP 103/52 | HR 70 | Temp 97.1°F | Resp 20 | Ht 62.0 in | Wt 113.0 lb

## 2019-11-23 DIAGNOSIS — K2970 Gastritis, unspecified, without bleeding: Secondary | ICD-10-CM

## 2019-11-23 DIAGNOSIS — K2100 Gastro-esophageal reflux disease with esophagitis, without bleeding: Secondary | ICD-10-CM

## 2019-11-23 DIAGNOSIS — Z800 Family history of malignant neoplasm of digestive organs: Secondary | ICD-10-CM

## 2019-11-23 DIAGNOSIS — K2950 Unspecified chronic gastritis without bleeding: Secondary | ICD-10-CM | POA: Diagnosis not present

## 2019-11-23 DIAGNOSIS — K921 Melena: Secondary | ICD-10-CM

## 2019-11-23 DIAGNOSIS — K2990 Gastroduodenitis, unspecified, without bleeding: Secondary | ICD-10-CM

## 2019-11-23 DIAGNOSIS — K640 First degree hemorrhoids: Secondary | ICD-10-CM | POA: Diagnosis not present

## 2019-11-23 DIAGNOSIS — K219 Gastro-esophageal reflux disease without esophagitis: Secondary | ICD-10-CM

## 2019-11-23 DIAGNOSIS — R197 Diarrhea, unspecified: Secondary | ICD-10-CM | POA: Diagnosis not present

## 2019-11-23 DIAGNOSIS — D125 Benign neoplasm of sigmoid colon: Secondary | ICD-10-CM

## 2019-11-23 DIAGNOSIS — K317 Polyp of stomach and duodenum: Secondary | ICD-10-CM | POA: Diagnosis not present

## 2019-11-23 DIAGNOSIS — K621 Rectal polyp: Secondary | ICD-10-CM

## 2019-11-23 DIAGNOSIS — D128 Benign neoplasm of rectum: Secondary | ICD-10-CM

## 2019-11-23 DIAGNOSIS — D127 Benign neoplasm of rectosigmoid junction: Secondary | ICD-10-CM | POA: Diagnosis not present

## 2019-11-23 DIAGNOSIS — K635 Polyp of colon: Secondary | ICD-10-CM | POA: Diagnosis not present

## 2019-11-23 DIAGNOSIS — D122 Benign neoplasm of ascending colon: Secondary | ICD-10-CM

## 2019-11-23 MED ORDER — PANTOPRAZOLE SODIUM 40 MG PO TBEC: 40.0000 mg | DELAYED_RELEASE_TABLET | Freq: Two times a day (BID) | ORAL | 0 refills | Status: DC

## 2019-11-23 MED ORDER — SODIUM CHLORIDE 0.9 % IV SOLN: 500.0000 mL | Freq: Once | INTRAVENOUS | Status: DC

## 2019-11-23 NOTE — Patient Instructions (Signed)
Resume Protonix (pantoprazole) 40mg  by mouth twice daily for 8 weeks to promote mucosal healing.   Use fiber, for example Citrucel, Fibercon, Konsyl or Metamucil.  Handouts provided on polyps, gastritis, esophagitis, and hemorrhoids and hemorrhoid banding.   YOU HAD AN ENDOSCOPIC PROCEDURE TODAY AT [facility]:   Refer to the procedure report that was given to you for any specific questions about what was found during the examination.  If the procedure report does not answer your questions, please call your gastroenterologist to clarify.  If you requested that your care partner not be given the details of your procedure findings, then the procedure report has been included in a sealed envelope for you to review at your convenience later.  YOU SHOULD EXPECT: Some feelings of bloating in the abdomen. Passage of more gas than usual.  Walking can help get rid of the air that was put into your GI tract during the procedure and reduce the bloating. If you had a lower endoscopy (such as a colonoscopy or flexible sigmoidoscopy) you may notice spotting of blood in your stool or on the toilet paper. If you underwent a bowel prep for your procedure, you may not have a normal bowel movement for a few days.  Please Note:  You might notice some irritation and congestion in your nose or some drainage.  This is from the oxygen used during your procedure.  There is no need for concern and it should clear up in a day or so.  SYMPTOMS TO REPORT IMMEDIATELY:   Following lower endoscopy (colonoscopy or flexible sigmoidoscopy):  Excessive amounts of blood in the stool  Significant tenderness or worsening of abdominal pains  Swelling of the abdomen that is new, acute  Fever of 100F or higher   Following upper endoscopy (EGD)  Vomiting of blood or coffee ground material  New chest pain or pain under the shoulder blades  Painful or persistently difficult swallowing  New shortness of breath  Fever  of 100F or higher  Black, tarry-looking stools  For urgent or emergent issues, a gastroenterologist can be reached at any hour by calling [phone].   DIET:  We do recommend a small meal at first, but then you may proceed to your regular diet.  Drink plenty of fluids but you should avoid alcoholic beverages for 24 hours.  ACTIVITY:  You should plan to take it easy for the rest of today and you should NOT DRIVE or use heavy machinery until tomorrow (because of the sedation medicines used during the test).    FOLLOW UP: Our staff will call the number listed on your records 48-72 hours following your procedure to check on you and address any questions or concerns that you may have regarding the information given to you following your procedure. If we do not reach you, we will leave a message.  We will attempt to reach you two times.  During this call, we will ask if you have developed any symptoms of COVID 19. If you develop any symptoms (ie: fever, flu-like symptoms, shortness of breath, cough etc.) before then, please call [phone].  If you test positive for Covid 19 in the 2 weeks post procedure, please call and report this information to [place].    If any biopsies were taken you will be contacted by phone or by letter within the next 1-3 weeks.  Please call us at [phone] if you have not heard about the biopsies in 3 weeks.  SIGNATURES/CONFIDENTIALITY: You and/or your care partner have signed paperwork which will be entered into your electronic medical record.  These signatures attest to the fact that that the information above on your After Visit Summary has been reviewed and is understood.  Full responsibility of the confidentiality of this discharge information lies with you and/or your care-partner.

## 2019-11-23 NOTE — Op Note (Signed)
[facility] Patient Name: Vicki Erickson Procedure Date: 11/23/2019 2:14 PM MRN: [id number] Endoscopist: Gerrit Heck , MD Age: 39 Referring MD:  Date of Birth: [birth year] Gender: Female Account #: [id number] Procedure:                Colonoscopy Indications:              Hematochezia, Diarrhea Medicines:                Monitored Anesthesia Care Procedure:                Pre-Anesthesia Assessment:                           - Prior to the procedure, a History and Physical                            was performed, and patient medications and                            allergies were reviewed. The patient's tolerance of                            previous anesthesia was also reviewed. The risks                            and benefits of the procedure and the sedation                            options and risks were discussed with the patient.                            All questions were answered, and informed consent                            was obtained. Prior Anticoagulants: The patient has                            taken no previous anticoagulant or antiplatelet                            agents. ASA Grade Assessment: II - A patient with                            mild systemic disease. After reviewing the risks                            and benefits, the patient was deemed in                            satisfactory condition to undergo the procedure.                           After obtaining informed consent, the colonoscope  was passed under direct vision. Throughout the                            procedure, the patient's blood pressure, pulse, and                            oxygen saturations were monitored continuously. The                            Colonoscope was introduced through the anus and                            advanced to the the terminal ileum. The colonoscopy                            was performed without difficulty. The  patient                            tolerated the procedure well. The quality of the                            bowel preparation was adequate. The terminal ileum,                            ileocecal valve, appendiceal orifice, and rectum                            were photographed. Scope In: 2:35:37 PM Scope Out: 2:55:05 PM Scope Withdrawal Time: 0 hours 18 minutes 13 seconds  Total Procedure Duration: 0 hours 19 minutes 28 seconds  Findings:                 The perianal and digital rectal examinations were                            normal.                           A 12 mm polyp was found in the ascending colon. The                            polyp was flat with adherent mucus cap. The polyp                            was removed with a cold snare. Resection and                            retrieval were complete. Estimated blood loss was                            minimal.                           Four sessile polyps were found in the rectum (1)  and sigmoid colon (3). The polyps were 2 to 6 mm in                            size. These polyps were removed with a cold snare.                            Resection and retrieval were complete. Estimated                            blood loss was minimal.                           The visualized mucosa was otherwise normal                            appearing throughout the colon. Biopsies for                            histology were taken with a cold forceps from the                            right colon and left colon for evaluation of                            microscopic colitis. Estimated blood loss was                            minimal.                           Non-bleeding internal hemorrhoids were found during                            retroflexion. The hemorrhoids were small and Grade                            I (internal hemorrhoids that do not prolapse).                           The terminal  ileum appeared normal. Complications:            No immediate complications. Estimated Blood Loss:     Estimated blood loss was minimal. Impression:               - One 12 mm polyp in the ascending colon, removed                            with a cold snare. Resected and retrieved.                           - Four 2 to 6 mm polyps in the rectum and in the                            sigmoid colon, removed with a cold snare.  Resected                            and retrieved.                           - Normal mucosa in the entire examined colon.                            Biopsied.                           - Non-bleeding internal hemorrhoids.                           - The examined portion of the ileum was normal. Recommendation:           - Patient has a contact number available for                            emergencies. The signs and symptoms of potential                            delayed complications were discussed with the                            patient. Return to normal activities tomorrow.                            Written discharge instructions were provided to the                            patient.                           - Resume previous diet.                           - Continue present medications.                           - Await pathology results.                           - Repeat colonoscopy in 3 years for surveillance.                           - Return to GI office at appointment to be                            scheduled.                           - Use fiber, for example Citrucel, Fibercon, Konsyl                            or Metamucil.                           -  Internal hemorrhoids were noted on this study and                            may be amenable to hemorrhoid band ligation. If you                            are interested in further treatment of these                            hemorrhoids with band ligation, please contact my                             clinic to set up an appointment for evaluation and                            treatment. Gerrit Heck, MD 11/23/2019 3:27:14 PM

## 2019-11-23 NOTE — Progress Notes (Signed)
To PACU, VSS. Report to [email or web address] 

## 2019-11-23 NOTE — Progress Notes (Signed)
Called to room to assist during endoscopic procedure.  Patient ID and intended procedure confirmed with present staff. Received instructions for my participation in the procedure from the performing physician.  

## 2019-11-23 NOTE — Op Note (Signed)
[facility] Patient Name: Vicki Erickson Procedure Date: 11/23/2019 2:15 PM MRN: [id number] Endoscopist: Gerrit Heck , MD Age: 39 Referring MD:  Date of Birth: [birth year] Gender: Female Account #: [id number] Procedure:                Upper GI endoscopy Indications:              Suspected esophageal reflux, Diarrhea Medicines:                Monitored Anesthesia Care Procedure:                Pre-Anesthesia Assessment:                           - Prior to the procedure, a History and Physical                            was performed, and patient medications and                            allergies were reviewed. The patient's tolerance of                            previous anesthesia was also reviewed. The risks                            and benefits of the procedure and the sedation                            options and risks were discussed with the patient.                            All questions were answered, and informed consent                            was obtained. Prior Anticoagulants: The patient has                            taken no previous anticoagulant or antiplatelet                            agents. ASA Grade Assessment: II - A patient with                            mild systemic disease. After reviewing the risks                            and benefits, the patient was deemed in                            satisfactory condition to undergo the procedure.                           After obtaining informed consent, the endoscope was  passed under direct vision. Throughout the                            procedure, the patient's blood pressure, pulse, and                            oxygen saturations were monitored continuously. The                            Endoscope was introduced through the mouth, and                            advanced to the second part of duodenum. The upper                            GI endoscopy was  accomplished without difficulty.                            The patient tolerated the procedure well. Scope In: Scope Out: Findings:                 LA Grade B (one or more mucosal breaks greater than                            5 mm, not extending between the tops of two mucosal                            folds) esophagitis with no bleeding was found in                            the lower third of the esophagus.                           The gastroesophageal flap valve was visualized                            endoscopically and classified as Hill Grade III                            (minimal fold, loose to endoscope, hiatal hernia                            likely). There was a 2 cm transverse width hernia                            noted on retroflexion during full insufflation. No                            axial hernia noted on anterograde views.                           The upper third of the esophagus and middle third  of the esophagus were normal.                           Scattered mild inflammation characterized by                            erythema was found in the gastric fundus, in the                            gastric body, at the incisura and in the gastric                            antrum. Biopsies were taken with a cold forceps for                            Helicobacter pylori testing. Estimated blood loss                            was minimal.                           A single 3 mm sessile polyp with no bleeding was                            found in the second portion of the duodenum. The                            polyp was removed with a cold biopsy forceps.                            Resection and retrieval were complete. Estimated                            blood loss was minimal.                           Patchy mildly erythematous mucosa without active                            bleeding and with no stigmata of bleeding was found                             in the duodenal bulb. Biopsies were taken with a                            cold forceps for histology. Estimated blood loss                            was minimal. The 2nd portion of the duodenum was                            otherwise normal appearing. Biopsies taken to  evaluate for Celiac. Complications:            No immediate complications. Estimated Blood Loss:     Estimated blood loss was minimal. Impression:               - LA Grade B reflux esophagitis with no bleeding.                           - Gastroesophageal flap valve classified as Hill                            Grade III (minimal fold, loose to endoscope, hiatal                            hernia likely).                           - Normal upper third of esophagus and middle third                            of esophagus.                           - Gastritis. Biopsied.                           - A single duodenal polyp. Resected and retrieved.                           - Erythematous duodenopathy. Biopsied. Recommendation:           - Patient has a contact number available for                            emergencies. The signs and symptoms of potential                            delayed complications were discussed with the                            patient. Return to normal activities tomorrow.                            Written discharge instructions were provided to the                            patient.                           - Resume previous diet.                           - Continue present medications.                           - Await pathology results.                           -  Resume Protonix (pantoprazole) 40 mg PO BID for 8                            weeks to promote mucosal healing.                           - Return to GI clinic at appointment to be                            scheduled. Will discuss options for ongoing                             management of reflux, to include continued medical                            management vs antrireflux surgery. Given the degree                            of LES laxity with a Hill Grade 3 valve, if                            considering antireflux surgery, recommend                            concomittant laparoscopic hiatus repair along with                            Transoral Incisionless Fundoplication (TIF).                           - Colonoscopy today. Gerrit Heck, MD 11/23/2019 3:11:11 PM

## 2019-11-23 NOTE — Progress Notes (Signed)
Pt's states no medical or surgical changes since previsit or office visit.  JB - temp DT - vitals 

## 2019-11-24 ENCOUNTER — Encounter: Payer: Self-pay | Admitting: Cardiology

## 2019-11-24 LAB — GASTROINTESTINAL PATHOGEN PANEL PCR
C. difficile Tox A/B, PCR: NOT DETECTED
Campylobacter, PCR: NOT DETECTED
Cryptosporidium, PCR: NOT DETECTED
E coli (ETEC) LT/ST PCR: NOT DETECTED
E coli (STEC) stx1/stx2, PCR: NOT DETECTED
E coli 0157, PCR: NOT DETECTED
Giardia lamblia, PCR: NOT DETECTED
Norovirus, PCR: NOT DETECTED
Rotavirus A, PCR: NOT DETECTED
Salmonella, PCR: NOT DETECTED
Shigella, PCR: NOT DETECTED

## 2019-11-24 NOTE — Telephone Encounter (Signed)
Left message for patient to call and schedule ETT and covid testing ordered by Dr. Percival Spanish

## 2019-11-25 ENCOUNTER — Telehealth: Payer: Self-pay

## 2019-11-25 ENCOUNTER — Telehealth: Payer: Self-pay | Admitting: *Deleted

## 2019-11-25 NOTE — Telephone Encounter (Signed)
  Follow up Call-  Call back number 11/23/2019  Post procedure Call Back phone  # [phone]  Permission to leave phone message Yes  Some recent data might be hidden     Patient questions:  Do you have a fever, pain , or abdominal swelling? No. Pain Score  0 *  Have you tolerated food without any problems? Yes.    Have you been able to return to your normal activities? Yes.    Do you have any questions about your discharge instructions: Diet   No. Medications  No. Follow up visit  No.  Do you have questions or concerns about your Care? No.  Actions: * If pain score is 4 or above: No action needed, pain <4.  1. Have you developed a fever since your procedure? no  2.   Have you had an respiratory symptoms (SOB or cough) since your procedure? no  3.   Have you tested positive for COVID 19 since your procedure no  4.   Have you had any family members/close contacts diagnosed with the COVID 19 since your procedure?  no   If yes to any of these questions please route to Joylene John, RN and Alphonsa Gin, Therapist, sports.

## 2019-11-25 NOTE — Telephone Encounter (Signed)
  Follow up Call-  Call back number 11/23/2019  Post procedure Call Back phone  # [phone]  Permission to leave phone message Yes  Some recent data might be hidden     Patient questions:  Do you have a fever, pain , or abdominal swelling? No. Pain Score  0 *  Have you tolerated food without any problems? Yes.    Have you been able to return to your normal activities? Yes.    Do you have any questions about your discharge instructions: Diet   No. Medications  No. Follow up visit  No.  Do you have questions or concerns about your Care? No.  Actions: * If pain score is 4 or above: No action needed, pain <4.  1. Have you developed a fever since your procedure? no  2.   Have you had an respiratory symptoms (SOB or cough) since your procedure? no  3.   Have you tested positive for COVID 19 since your procedure no  4.   Have you had any family members/close contacts diagnosed with the COVID 19 since your procedure?  no   If yes to any of these questions please route to Joylene John, RN and Alphonsa Gin, Therapist, sports.

## 2019-12-03 ENCOUNTER — Encounter: Payer: Self-pay | Admitting: Gastroenterology

## 2019-12-13 ENCOUNTER — Telehealth: Payer: Self-pay | Admitting: Gastroenterology

## 2019-12-13 NOTE — Telephone Encounter (Signed)
Patient returning your call.

## 2019-12-13 NOTE — Telephone Encounter (Signed)
Left message for patient to call back to the office;  

## 2019-12-14 ENCOUNTER — Encounter (HOSPITAL_COMMUNITY): Payer: Self-pay | Admitting: Cardiology

## 2019-12-14 NOTE — Telephone Encounter (Signed)
Patient is following up on call.  

## 2019-12-15 ENCOUNTER — Ambulatory Visit (INDEPENDENT_AMBULATORY_CARE_PROVIDER_SITE_OTHER): Payer: BC Managed Care – PPO | Admitting: Gastroenterology

## 2019-12-15 ENCOUNTER — Encounter: Payer: Self-pay | Admitting: Gastroenterology

## 2019-12-15 ENCOUNTER — Other Ambulatory Visit: Payer: Self-pay

## 2019-12-15 VITALS — BP 106/64 | HR 90 | Temp 98.2°F | Ht 62.0 in | Wt 107.4 lb

## 2019-12-15 DIAGNOSIS — K2100 Gastro-esophageal reflux disease with esophagitis, without bleeding: Secondary | ICD-10-CM

## 2019-12-15 DIAGNOSIS — R1031 Right lower quadrant pain: Secondary | ICD-10-CM

## 2019-12-15 DIAGNOSIS — K649 Unspecified hemorrhoids: Secondary | ICD-10-CM

## 2019-12-15 DIAGNOSIS — K449 Diaphragmatic hernia without obstruction or gangrene: Secondary | ICD-10-CM | POA: Diagnosis not present

## 2019-12-15 DIAGNOSIS — K921 Melena: Secondary | ICD-10-CM

## 2019-12-15 NOTE — Progress Notes (Addendum)
P  Chief Complaint:    GERD, diarrhea  GI History: 39 year old female with a history of anxiety, depression, ADHD, hypotension, tobacco use, follows in the GI clinic for reflux pain and diarrhea.  1) GERD: Index symptoms of regurgitation, nausea, chronic cough, raspy voice, increased throat clearing. Nocturnal cough and choking sensation. No associated exacerbating foods.  Occasional mucus-like sensation in esophagus, no dysphagia or odynophagia.  Has been taking antiemetics for 10-12 years, and was told she had reflux approx 17 years ago. Was seen by ENT and told this was reflux related sxs. Started taking OTC omeprazole but continued breakthrough symptoms. Started on Protonix 40 mg/day in 10/2018 with incomplete response, and increased to 40 mg bid in 11/2018.   -EGD (11/2019, Dr. Bryan Lemma): LA Grade B esophagitis, Hill grade 3 valve, mild non-H. pylori gastritis, benign duodenal polyp -GERD-HRQL Questionnaire score: 27/50, checked "dissatisfied "with current health condition due to reflux  2) Diarrhea: Episodic watery, nonbloody stools for the last 2 years.  Occasional scant BRB on tissue paper.  Has had stool incontinence.  Some improvement with trial of Imodium prn.  Was recently started on Lomotil with resolution. But she would like to find etiology rather than ongoing med use. Consumes a good amount of dietary fiber. Has not trialed other meds or fiber supplement, etc. No preceding exposures, travel, Abx, etc. -Negative GI PCR panel -Colonoscopy (11/2019, Dr. Bryan Lemma): 12 mm polyp (SSP), 4 polyps in rectum and sigmoid (SSP), internal hemorrhoids, normal TI.  Otherwise, biopsies negative for MC.  Repeat in 3 years  3) postnasal drainage: Postnasal drip symptoms x1.5-2 years.  Was evaluated by ENT at [facility] in 07/2019.  Felt postnasal drainage was related to reflux advised Prilosec 20 mg bid and nasal hygiene instructions.  HPI:     Patient is a 39 y.o. female presenting to the  Gastroenterology Clinic for follow-up.  Currently taking Protonix 80 mg every morning (instead of 40 bid). Still with frequent breakthrough reflux sxs, particularly nocturnal choking sensation. Still post nasal drip sxs.  She would like to discuss cTIF for more definitive treatment of reflux.  Stools improved since colonoscopy. Will still use Lomotil prn, but less frequent use.  Has not started high-fiber diet or fiber supplement.  She separately c/o RLQ pain.  Pain started after coughing episode and felt a "tearing".  No fever or chills.  She is requesting abdominal CT.    Finally, she reports symptomatic hemorrhoids and is interested in scheduling for hemorrhoid banding.  Rectal irritation, itching.    Review of systems:     No chest pain, no SOB, no fevers, no urinary sx   Past Medical History:  Diagnosis Date  . ADHD   . ANEMIA, IRON DEFICIENCY 09/25/2007   Qualifier: Diagnosis of  By: Wynetta Emery LPN, Kim    . Anxiety   . Cystitis 12/22/2018  . Decreased libido 09/17/2019  . Depression   . 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 GERD (gastroesophageal reflux disease) 10/27/2019  . Hypotension 09/17/2019  . PTSD (post-traumatic stress disorder)     Patient's surgical history, family medical history, social history, medications and allergies were all reviewed in Epic    Current Outpatient Medications  Medication Sig Dispense Refill  . diphenoxylate-atropine (LOMOTIL) 2.5-0.025 MG tablet Take 1-2 tablets by mouth 4 (four) times daily as needed for diarrhea or loose stools. (Patient taking differently: Take 1 tablet by mouth as needed. ) 30 tablet 0  . pantoprazole (PROTONIX) 40 MG tablet Take 1 tablet (  40 mg total) by mouth 2 (two) times daily. (Patient taking differently: Take 80 mg by mouth daily. ) 112 tablet 0  . SPRINTEC 28 0.25-35 MG-MCG tablet daily.    Marland Kitchen amphetamine-dextroamphetamine (ADDERALL) 10 MG tablet Take 0.5 tablets by mouth as needed.     . busPIRone (BUSPAR) 7.5 MG tablet  Take 1 tablet by mouth 2 (two) times daily.    Marland Kitchen FLUoxetine (PROZAC) 20 MG capsule Take 20 mg by mouth daily.     Marland Kitchen lamoTRIgine (LAMICTAL) 25 MG tablet TAKE 1 TABLET BY MOUTH ONCE DAILY FOR MOOD    . OLANZapine (ZYPREXA) 5 MG tablet TAKE 1 TABLET BY MOUTH IN THE EVENING AFTER SUPPER    . promethazine (PHENERGAN) 25 MG tablet TAKE 1 TABLET BY MOUTH EVERY 6 HOURS AS NEEDED FOR NAUSEA     No current facility-administered medications for this visit.    Physical Exam:     BP 106/64   Pulse 90   Temp 98.2 F (36.8 C)   Ht 5\' 2"  (1.575 m)   Wt 107 lb 6 oz (48.7 kg)   BMI 19.64 kg/m   GENERAL:  Pleasant female in NAD PSYCH: : Cooperative, normal affect EENT:  conjunctiva pink, mucous membranes moist, neck supple without masses CARDIAC:  RRR, no murmur heard, no peripheral edema PULM: Normal respiratory effort, lungs CTA bilaterally, no wheezing ABDOMEN: RLQ and lower mid abdominal TTP.  No rebound or guarding.  Negative Carnett's sign.  No peritoneal signs.  Otherwise soft, nondistended.  No obvious masses, no hepatomegaly,  normal bowel sounds SKIN:  turgor, no lesions seen Musculoskeletal:  Normal muscle tone, normal strength NEURO: Alert and oriented x 3, no focal neurologic deficits   IMPRESSION and PLAN:    1) GERD with erosive esophagitis 2) LES laxity/Diaphragmatic defect Longstanding history of reflux with erosive esophagitis.  She is requesting antireflux surgery as a means to improve reflux symptoms and stop or significantly reduce need for acid suppression therapy.  Given Hill grade 3 valve, my recommendation is for cTIF -Referral to Dr. Kae Heller at [facility] for evaluation of laparoscopic hernia repair/diaphragmatic defect repair with concomitant TIF -Change Protonix to 40 mg bid, taken 30-60 minutes before breakfast/dinner -HOB elevation, avoid eating within 3 hours of bedtime, avoid overeating -Spent significant time discussing the risks, benefits, alternatives of TIF to include  postoperative dietary and activity restrictions.  Discussed 1 night hospitalization, and she wishes to proceed with surgical evaluation and cTIF  3) Internal hemorrhoids: - Schedule f/u for hemorrhoid banding  4) Chronic diarrhea: -Evaluation to date otherwise unrevealing to include colonoscopy GI PCR panel.  Symptoms have improved as of late.  Responsive to Lomotil, but does not want to keep taking medications. - Increase dietary fiber and start fiber supplement  5) Lower abdominal pain/RLQ pain: Negative Carnett's sign.  Very concerned about extraluminal pathology and requesting CT.  Given clinical exam and recent otherwise negative endoscopic evaluation for this complaint, plan for cross-sectional imaging - CT abd/pelvis   - patient's insurance changes in June 2021 and requesting all studies/procedures done prior (not sure if June 1 or June 30)  I spent 45 minutes of time, including in depth chart review, independent review of results as outlined above, communicating results with the patient directly, face-to-face time with the patient, coordinating care, ordering studies and medications as appropriate, and documentation.        Lavena Bullion ,DO, FACG 12/15/2019, 9:55 AM

## 2019-12-15 NOTE — Patient Instructions (Addendum)
You have been scheduled for a CT scan of the abdomen and pelvis at [facility] are scheduled on 12/20/19 at 11am. You should arrive 15 minutes prior to your appointment time for registration. Please follow the written instructions below on the day of your exam:  WARNING: IF YOU ARE ALLERGIC TO IODINE/X-RAY DYE, PLEASE NOTIFY RADIOLOGY IMMEDIATELY AT [phone]! YOU WILL BE GIVEN A 13 HOUR PREMEDICATION PREP.  1) Do not eat or drink anything after 7am (4 hours prior to your test) 2) You have been given 2 bottles of oral contrast to drink. The solution may taste better if refrigerated, but do NOT add ice or any other liquid to this solution. Shake well before drinking.    Drink 1 bottle of contrast @ 9am (2 hours prior to your exam)  Drink 1 bottle of contrast @ 10am (1 hour prior to your exam)  You may take any medications as prescribed with a small amount of water, if necessary. If you take any of the following medications: METFORMIN, GLUCOPHAGE, GLUCOVANCE, AVANDAMET, RIOMET, FORTAMET, [place] MET, JANUMET, GLUMETZA or METAGLIP, you MAY be asked to HOLD this medication 48 hours AFTER the exam.  The purpose of you drinking the oral contrast is to aid in the visualization of your intestinal tract. The contrast solution may cause some diarrhea. Depending on your individual set of symptoms, you may also receive an intravenous injection of x-ray contrast/dye. Plan on being at [facility] for 30 minutes or longer, depending on the type of exam you are having performed.  This test typically takes 30-45 minutes to complete.  We have sent a referral to [facility] at [address] [place], [place] [address]. [phone]. You will be receiving a call to schedule an appointment with them soon.  It was a pleasure to see you today!  Gerrit Heck, D.O.   ________________________________________________________________________

## 2019-12-17 ENCOUNTER — Ambulatory Visit (INDEPENDENT_AMBULATORY_CARE_PROVIDER_SITE_OTHER): Payer: BC Managed Care – PPO | Admitting: Gastroenterology

## 2019-12-17 ENCOUNTER — Other Ambulatory Visit: Payer: Self-pay

## 2019-12-17 ENCOUNTER — Telehealth: Payer: Self-pay | Admitting: Gastroenterology

## 2019-12-17 ENCOUNTER — Encounter: Payer: Self-pay | Admitting: Gastroenterology

## 2019-12-17 VITALS — BP 104/60 | HR 102 | Temp 98.2°F | Ht 62.0 in | Wt 106.4 lb

## 2019-12-17 DIAGNOSIS — R197 Diarrhea, unspecified: Secondary | ICD-10-CM

## 2019-12-17 DIAGNOSIS — K449 Diaphragmatic hernia without obstruction or gangrene: Secondary | ICD-10-CM

## 2019-12-17 DIAGNOSIS — K640 First degree hemorrhoids: Secondary | ICD-10-CM

## 2019-12-17 DIAGNOSIS — K2100 Gastro-esophageal reflux disease with esophagitis, without bleeding: Secondary | ICD-10-CM

## 2019-12-17 DIAGNOSIS — K644 Residual hemorrhoidal skin tags: Secondary | ICD-10-CM | POA: Diagnosis not present

## 2019-12-17 NOTE — Telephone Encounter (Signed)
Agree with supportive care and OTC therapy as outlined.  Does not sound like this is related to hemorrhoid banding given the absence of hematochezia, rectal pain, dyschezia.  Otherwise tolerated the procedure without issue and was observed in recovery without issue earlier today.

## 2019-12-17 NOTE — Patient Instructions (Signed)
It was a pleasure to see you today!  Vito Cirigliano, D.O.  

## 2019-12-17 NOTE — Telephone Encounter (Signed)
Called and spoke with patient - patient reports she was told by her PCP that the pain is more likely related to "something I pulled from being really active"; patient reports she will call back to the office should the symptoms return; Patient advised to call back to the office at [phone] should questions/concerns arise;  Patient verbalized understanding of information/instructions;

## 2019-12-17 NOTE — Progress Notes (Signed)
P  Chief Complaint:    Symptomatic Internal Hemorrhoids; Hemorrhoid Band Ligation  GI History: 39 year old female with a history of anxiety, depression, ADHD, hypotension, tobacco use, follows in the GI clinic for reflux, diarrhea, and symptomatic hemorrhoids.  1) GERD: Index symptoms ofregurgitation,nausea, chronic cough, raspy voice, increased throat clearing. Nocturnal cough and choking sensation. No associated exacerbating foods.  Occasional mucus-like sensation in esophagus, no dysphagia or odynophagia.  Has been taking antiemetics for 10-12 years, and was told she had reflux approx 17 years ago. Was seen by ENT and told this was reflux related sxs. Started taking OTC omeprazolebut continuedbreakthrough symptoms. Started on Protonix 40 mg/day in 10/2018 with incomplete response, and increased to 40 mg bid in 11/2018. -EGD (11/2019, Dr. Bryan Lemma): LA Grade B esophagitis, Hill grade 3 valve, mild non-H. pylori gastritis, benign duodenal polyp -GERD-HRQL Questionnaire score: 27/50, checked "dissatisfied "with current health condition due to reflux -Referred to Dr. Kae Heller at [facility] for consideration of cTIF  2) Diarrhea: Episodic watery, nonbloody stools for the last 2 years. Occasional scant BRB on tissue paper. Has had stool incontinence. Some improvement with trial of Imodium prn. Was recently started on Lomotil with resolution. But she would like to find etiology rather than ongoing med use. Consumes a good amount of dietary fiber. Has not trialed other meds or fiber supplement, etc. No preceding exposures, travel, Abx, etc. -Negative GI PCR panel -Colonoscopy (11/2019, Dr. Bryan Lemma): 12 mm polyp (SSP), 4 polyps in rectum and sigmoid (SSP), internal hemorrhoids, normal TI.  Otherwise, biopsies negative for MC.  Repeat in 3 years  3) postnasal drainage: Postnasal drip symptoms x1.5-2 years. Was evaluated by ENT at [facility] in 07/2019. Felt postnasal drainage was related to  reflux advised Prilosec 20 mg bidand nasal hygiene instructions.  4) Symptomatic hemorrhoids: Intermittent hematochezia, rectal irritation, itching.  Suboptimal response to conservative management. -12/17/2019: Presents for hemorrhoid banding #1  HPI:     Patient is a 39 y.o. femalewith a history of symptomatic internal hemorrhoids presenting to the Gastroenterology Clinic for follow-up and ongoing treatment. The patient presents with symptomatic grade 1-2 hemorrhoids (hematochezia, unresponsive to maximal medical therapy, requesting rubber band ligation of symptomatic hemorrhoidal disease.  No change in medical or surgical history, medications, allergies, social history since last appointment with me.   Review of systems:     No chest pain, no SOB, no fevers, no urinary sx   Past Medical History:  Diagnosis Date  . ADHD   . ANEMIA, IRON DEFICIENCY 09/25/2007   Qualifier: Diagnosis of  By: Wynetta Emery LPN, Kim    . Anxiety   . Cystitis 12/22/2018  . Decreased libido 09/17/2019  . Depression   . 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 GERD (gastroesophageal reflux disease) 10/27/2019  . Hypotension 09/17/2019  . PTSD (post-traumatic stress disorder)     Patient's surgical history, family medical history, social history, medications and allergies were all reviewed in Epic    Current Outpatient Medications  Medication Sig Dispense Refill  . diphenoxylate-atropine (LOMOTIL) 2.5-0.025 MG tablet Take 1-2 tablets by mouth 4 (four) times daily as needed for diarrhea or loose stools. (Patient taking differently: Take 1 tablet by mouth as needed. ) 30 tablet 0  . pantoprazole (PROTONIX) 40 MG tablet Take 1 tablet (40 mg total) by mouth 2 (two) times daily. (Patient taking differently: Take 80 mg by mouth daily. ) 112 tablet 0  . SPRINTEC 28 0.25-35 MG-MCG tablet daily.    Marland Kitchen amphetamine-dextroamphetamine (ADDERALL) 10 MG tablet Take  0.5 tablets by mouth as needed.     . busPIRone (BUSPAR) 7.5 MG tablet  Take 1 tablet by mouth 2 (two) times daily.    Marland Kitchen FLUoxetine (PROZAC) 20 MG capsule Take 20 mg by mouth daily.     Marland Kitchen lamoTRIgine (LAMICTAL) 25 MG tablet TAKE 1 TABLET BY MOUTH ONCE DAILY FOR MOOD    . OLANZapine (ZYPREXA) 5 MG tablet TAKE 1 TABLET BY MOUTH IN THE EVENING AFTER SUPPER    . promethazine (PHENERGAN) 25 MG tablet TAKE 1 TABLET BY MOUTH EVERY 6 HOURS AS NEEDED FOR NAUSEA     No current facility-administered medications for this visit.    Physical Exam:     BP 104/60   Pulse (!) 102   Temp 98.2 F (36.8 C)   Ht 5\' 2"  (1.575 m)   Wt 106 lb 6 oz (48.3 kg)   BMI 19.46 kg/m   GENERAL:  Pleasant female in NAD PSYCH: : Cooperative, normal affect EENT:  conjunctiva pink, mucous membranes moist SKIN:  turgor, no lesions seen Musculoskeletal:  Normal muscle tone, normal strength NEURO: Alert and oriented x 3, no focal neurologic deficits Rectal exam: Sensation intact and preserved anal wink.   Small external skin tag.  Grade 1 hemorrhoids noted in all positions on anoscopy.  No external anal fissures noted. Normal sphincter tone. No palpable mass. No blood on the exam glove. (Chaperone: Curlene Labrum, CMA).   IMPRESSION and PLAN:    #1.  Symptomatic internal hemorrhoids: PROCEDURE NOTE: The patient presents with symptomatic grade 1 hemorrhoids, unresponsive to maximal medical therapy, requesting rubber band ligation of symptomatic hemorrhoidal disease.  All risks, benefits and alternative forms of therapy were described and informed consent was obtained.  In the Left Lateral Decubitus position, anoscopic examination revealed grade 1 hemorrhoids in the all position(s).  The anorectum was pre-medicated with RectiCare. The decision was made to band the RA internal hemorrhoid, and the [place] was used to perform band ligation without complication.  Digital anorectal examination was then performed to assure proper positioning of the band, and to adjust the banded tissue as  required.  The patient was discharged home without pain or other issues.  Dietary and behavioral recommendations were given and along with follow-up instructions.     The following adjunctive treatments were recommended:  -Resume high-fiber diet with fiber supplement (i.e. Citrucel or Benefiber) with goal for soft, formed stools. -Resume adequate fluid intake.  The patient will return in 2-4 for  follow-up and possible additional banding as required. No complications were encountered and the patient tolerated the procedure well.  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  She is requesting antireflux surgery as a means to improve reflux symptoms and stop or significantly reduce need for acid suppression therapy.  Given Hill grade 3 valve, being currently evaluated for cTIF -Referral to Dr. Kae Heller at [facility] already in place for evaluation of laparoscopic hernia repair/diaphragmatic defect repair with concomitant TIF -Resume Protonix 40 mg bid, taken 30-60 minutes before breakfast/dinner -HOB elevation, avoid eating within 3 hours of bedtime, avoid overeating  4) Chronic diarrhea: -Evaluation to date otherwise unrevealing to include colonoscopy GI PCR panel.  Symptoms have improved as of late. -Continue high-fiber diet and fiber supplement as previously discussed -Lomotil as needed   5) Lower abdominal pain/RLQ pain: - CT abd/pelvis previously ordered and scheduled       Lavena Bullion ,DO, FACG 12/17/2019, 8:59 AM

## 2019-12-17 NOTE — Telephone Encounter (Signed)
Called and spoke with patient-patient reports she is having ovary area and vaginal area cramps -"really achy"- discomfort;  Advised to take Motrin alternating with Tylenol-she reports she took 3 Advil at 11:30am-helped "somewhat";   Denies rectal bleeding/rectal pain/fever/nausea/vomiting;  Patient reports she is not "close to time for my period";  Patient reports she is probably going to call her PCP because she feels like this might be related to muscles-a torn muscle-  Please advise if further actions are needed by GI

## 2019-12-20 ENCOUNTER — Other Ambulatory Visit: Payer: Self-pay

## 2019-12-20 ENCOUNTER — Encounter (HOSPITAL_BASED_OUTPATIENT_CLINIC_OR_DEPARTMENT_OTHER): Payer: Self-pay

## 2019-12-20 ENCOUNTER — Ambulatory Visit (HOSPITAL_BASED_OUTPATIENT_CLINIC_OR_DEPARTMENT_OTHER)
Admission: RE | Admit: 2019-12-20 | Discharge: 2019-12-20 | Disposition: A | Discharge status: Home / Self Care | Payer: BC Managed Care – PPO | Source: Ambulatory Visit | Attending: Gastroenterology | Admitting: Gastroenterology

## 2019-12-20 DIAGNOSIS — R1031 Right lower quadrant pain: Secondary | ICD-10-CM | POA: Diagnosis not present

## 2019-12-20 DIAGNOSIS — K449 Diaphragmatic hernia without obstruction or gangrene: Secondary | ICD-10-CM | POA: Diagnosis not present

## 2019-12-20 DIAGNOSIS — K649 Unspecified hemorrhoids: Secondary | ICD-10-CM | POA: Diagnosis not present

## 2019-12-20 DIAGNOSIS — K921 Melena: Secondary | ICD-10-CM | POA: Diagnosis not present

## 2019-12-20 MED ORDER — IOHEXOL 300 MG/ML  SOLN
100.0000 mL | Freq: Once | INTRAMUSCULAR | Status: AC | PRN
  Administered 2019-12-20: 100 mL via INTRAVENOUS

## 2019-12-20 NOTE — Telephone Encounter (Signed)
Pt returning your call

## 2019-12-20 NOTE — Telephone Encounter (Signed)
Left message for patient to call back to the office;  

## 2019-12-21 NOTE — Telephone Encounter (Signed)
CMA for Dr. Bryan Lemma has already spoken with this patient and a plan of care is in place; Patient advised to call back to the office at [phone] should questions/concerns arise;  Patient verbalized understanding of information/instructions;

## 2019-12-22 NOTE — Telephone Encounter (Signed)
Is this appropriate for each visit- Please advise

## 2019-12-22 NOTE — Telephone Encounter (Signed)
Patient is calling is seeking work note for previous visits.

## 2019-12-22 NOTE — Telephone Encounter (Signed)
Ok to supply work note for recent appointments in the GI clinic.

## 2019-12-23 DIAGNOSIS — Z23 Encounter for immunization: Secondary | ICD-10-CM | POA: Diagnosis not present

## 2019-12-23 NOTE — Telephone Encounter (Signed)
Called and spoke with patient-patient advised a letter would be sent to her MyChart account with the specific dates she has been seen in the clinic or has missed work due to her illness; patient verbalized appreciation of information;  Patient advised to call back to the office at [phone] should questions/concerns arise;  Patient verbalized understanding of information/instructions;

## 2019-12-24 ENCOUNTER — Telehealth (HOSPITAL_COMMUNITY): Payer: Self-pay | Admitting: Cardiology

## 2019-12-24 NOTE — Telephone Encounter (Signed)
Called and spoke with patient-advised patient to follow up with her PCP -  She is requesting to know if the migraine could be related to her GI symptoms-she reports she has been taking Midol/Tylenol/Advil/Excedrin  -please advise if this has anything to do with my symptoms

## 2019-12-24 NOTE — Telephone Encounter (Signed)
Just an FYI. We have made several attempts to contact this patient including sending a letter to schedule or reschedule their Myoview. We will be removing the patient from the echo [facility].  12/14/19 Mailed letter/LBW  12/13/2019 12/13/19 [facility] @ 10:45 to schedule ETT/LBW 12/08/19 Unable to LVM phone was not available/LBW 1:57pm 11/05/19 @ 9:15 am lm for patient to call and schedule kf  Thank you

## 2019-12-24 NOTE — Telephone Encounter (Signed)
Pt reported that she has been having a pounding headache and would like to discuss.

## 2019-12-24 NOTE — Telephone Encounter (Signed)
Agree with seeking f/u with PCM re: headaches. There is something called intestinal migraines, but this is uncommon and should w/u for other causes of headache first. Thanks.

## 2019-12-27 ENCOUNTER — Ambulatory Visit: Payer: BC Managed Care – PPO | Admitting: Gastroenterology

## 2019-12-27 NOTE — Telephone Encounter (Signed)
Called and spoke with patient-patient informed of MD recommendations; patient is agreeable with plan of care and reports she will f/u with her PCP concerning the migraines; Patient verbalized understanding of information/instructions;  Patient was advised to call the office at [phone] if questions/concerns arise;

## 2019-12-29 ENCOUNTER — Other Ambulatory Visit: Payer: Self-pay | Admitting: Family Medicine

## 2019-12-29 NOTE — Telephone Encounter (Signed)
WK-this was a PRN med that was Pasha Broad/C'ed/plz advise/has upcoming appt with you/thx dmf

## 2019-12-30 ENCOUNTER — Telehealth: Payer: Self-pay | Admitting: Family Medicine

## 2019-12-30 DIAGNOSIS — K591 Functional diarrhea: Secondary | ICD-10-CM

## 2019-12-30 NOTE — Telephone Encounter (Addendum)
Patient is calling and requesting a refill for diphen- atrop to be sent to [facility] on [address]. Patient has a TOC appointment scheduled on 3/29 with Dr. Ethelene Hal. Pls advise. CB is [phone]

## 2019-12-30 NOTE — Telephone Encounter (Signed)
Patient calling states that she needs refill on Lomotil this medication was discontinued but patient states that she would like a refill for diarrhea. Last visit 09/17/2019 she does have an upcoming appointment to see Dr. Ethelene Hal on 01/10/20. Please advise

## 2019-12-31 MED ORDER — DIPHENOXYLATE-ATROPINE 2.5-0.025 MG PO TABS: 1.0000 | ORAL_TABLET | Freq: Four times a day (QID) | ORAL | 0 refills | Status: DC | PRN

## 2019-12-31 NOTE — Telephone Encounter (Signed)
Left message on VM informing pt of refill

## 2019-12-31 NOTE — Telephone Encounter (Signed)
This is not a medicine that I am comfortable using on a chronic basis. I will refill it this time and we will discuss alternatives at her office visit.

## 2020-01-01 ENCOUNTER — Emergency Department (HOSPITAL_COMMUNITY)
Admission: EM | Admit: 2020-01-01 | Discharge: 2020-01-02 | Disposition: A | Discharge status: Home / Self Care | Payer: BC Managed Care – PPO | Attending: Emergency Medicine | Admitting: Emergency Medicine

## 2020-01-01 ENCOUNTER — Encounter (HOSPITAL_COMMUNITY): Payer: Self-pay | Admitting: Emergency Medicine

## 2020-01-01 ENCOUNTER — Other Ambulatory Visit: Payer: Self-pay

## 2020-01-01 DIAGNOSIS — R519 Headache, unspecified: Secondary | ICD-10-CM | POA: Insufficient documentation

## 2020-01-01 DIAGNOSIS — F17210 Nicotine dependence, cigarettes, uncomplicated: Secondary | ICD-10-CM | POA: Insufficient documentation

## 2020-01-01 DIAGNOSIS — F909 Attention-deficit hyperactivity disorder, unspecified type: Secondary | ICD-10-CM | POA: Insufficient documentation

## 2020-01-01 LAB — CBC WITH DIFFERENTIAL/PLATELET
Abs Immature Granulocytes: 0.04 10*3/uL (ref 0.00–0.07)
Basophils Absolute: 0.1 10*3/uL (ref 0.0–0.1)
Basophils Relative: 0 %
Eosinophils Absolute: 0.1 10*3/uL (ref 0.0–0.5)
Eosinophils Relative: 0 %
HCT: 39 % (ref 36.0–46.0)
Hemoglobin: 13.2 g/dL (ref 12.0–15.0)
Immature Granulocytes: 0 %
Lymphocytes Relative: 28 %
Lymphs Abs: 3.4 10*3/uL (ref 0.7–4.0)
MCH: 32.4 pg (ref 26.0–34.0)
MCHC: 33.8 g/dL (ref 30.0–36.0)
MCV: 95.8 fL (ref 80.0–100.0)
Monocytes Absolute: 0.8 10*3/uL (ref 0.1–1.0)
Monocytes Relative: 6 %
Neutro Abs: 8 10*3/uL — ABNORMAL HIGH (ref 1.7–7.7)
Neutrophils Relative %: 66 %
Platelets: 459 10*3/uL — ABNORMAL HIGH (ref 150–400)
RBC: 4.07 MIL/uL (ref 3.87–5.11)
RDW: 13.2 % (ref 11.5–15.5)
WBC: 12.3 10*3/uL — ABNORMAL HIGH (ref 4.0–10.5)
nRBC: 0 % (ref 0.0–0.2)

## 2020-01-01 LAB — I-STAT BETA HCG BLOOD, ED (MC, WL, AP ONLY): I-stat hCG, quantitative: <5 m[IU]/mL (ref <5)

## 2020-01-01 LAB — BASIC METABOLIC PANEL
Anion gap: 10 (ref 5–15)
BUN: <5 mg/dL — ABNORMAL LOW (ref 6–20)
CO2: 25 mmol/L (ref 22–32)
Calcium: 9.4 mg/dL (ref 8.9–10.3)
Chloride: 101 mmol/L (ref 98–111)
Creatinine, Ser: 0.95 mg/dL (ref 0.44–1.00)
GFR calc Af Amer: >60 mL/min (ref >60)
GFR calc non Af Amer: >60 mL/min (ref >60)
Glucose, Bld: 103 mg/dL — ABNORMAL HIGH (ref 70–99)
Potassium: 3.8 mmol/L (ref 3.5–5.1)
Sodium: 136 mmol/L (ref 135–145)

## 2020-01-01 NOTE — ED Triage Notes (Signed)
Patient reports migraine headache with emesis for 2 weeks unrelieved by OTC pain medications , no fever or photophobia .

## 2020-01-02 ENCOUNTER — Emergency Department (HOSPITAL_COMMUNITY): Payer: BC Managed Care – PPO

## 2020-01-02 DIAGNOSIS — R519 Headache, unspecified: Secondary | ICD-10-CM | POA: Diagnosis not present

## 2020-01-02 MED ORDER — KETOROLAC TROMETHAMINE 30 MG/ML IJ SOLN
30.0000 mg | Freq: Once | INTRAMUSCULAR | Status: AC
  Administered 2020-01-02: 30 mg via INTRAMUSCULAR
  Filled 2020-01-02: qty 1

## 2020-01-02 NOTE — ED Provider Notes (Signed)
[facility] EMERGENCY DEPARTMENT Provider Note   CSN: [id number] Arrival date & time: 01/01/20  2125     History Chief Complaint  Patient presents with  . Migraine    Vicki Erickson is a 39 y.o. female.  Patient presents to the emergency department with a chief complaint of headache.  She states that she has had a headache daily for several months.  She states that she was told by her doctor to follow-up with a neurologist.  She has made an appointment, but has not seen them yet.  She also reports nausea and vomiting x10 years.  She reports feeling hot and cold chronically, but denies any measured fever.  She has not had any relief of her symptoms with over-the-counter medications.  She states that the headache is worse when she wakes up and better when she goes to bed.  She denies any vision or speech changes.  The history is provided by the patient. No language interpreter was used.       Past Medical History:  Diagnosis Date  . ADHD   . ANEMIA, IRON DEFICIENCY 09/25/2007   Qualifier: Diagnosis of  By: Wynetta Emery LPN, Kim    . Anxiety   . Cystitis 12/22/2018  . Decreased libido 09/17/2019  . Depression   . 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 GERD (gastroesophageal reflux disease) 10/27/2019  . Hypotension 09/17/2019  . PTSD (post-traumatic stress disorder)     Patient Active Problem List   Diagnosis Date Noted  . Dizziness 11/01/2019  . Educated about COVID-19 virus infection 11/01/2019  . 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 GERD (gastroesophageal reflux disease) 10/27/2019  . Decreased libido 09/17/2019  . Hypotension 09/17/2019  . Cystitis 12/22/2018  . ANEMIA, IRON DEFICIENCY 09/25/2007    History reviewed. No pertinent surgical history.   OB History   No obstetric history on file.     Family History  Problem Relation Age of Onset  . COPD Mother   . Irritable bowel syndrome Mother   . Other Mother        esophageal polyps  . Skin cancer  Father   . Irritable bowel syndrome Father   . Other Maternal Grandfather        small cell  . Breast cancer Paternal Grandmother   . Stomach cancer Paternal Grandmother   . Colon cancer Paternal Grandmother   . Breast cancer Paternal Aunt        great aunt  . Ovarian cancer Maternal Aunt   . Esophageal cancer Neg Hx   . Rectal cancer Neg Hx     Social History   Tobacco Use  . Smoking status: Current Every Day Smoker    Packs/day: 0.50    Types: Cigarettes  . Smokeless tobacco: Never Used  Substance Use Topics  . Alcohol use: Yes    Comment: 1-2 day beer a day  . Drug use: Yes    Types: Marijuana    Home Medications Prior to Admission medications   Medication Sig Start Date End Date Taking? Authorizing Provider  ALPRAZolam Duanne Moron) 0.5 MG tablet Take 0.25 mg by mouth at bedtime as needed for anxiety.   Yes [provider]  aspirin-acetaminophen-caffeine (EXCEDRIN MIGRAINE) [phone] MG tablet Take 1 tablet by mouth every 6 (six) hours as needed for headache.   Yes [provider]  ibuprofen (ADVIL) 200 MG tablet Take 200 mg by mouth every 6 (six) hours as needed for headache.   Yes [provider]  pantoprazole (  PROTONIX) 40 MG tablet Take 1 tablet (40 mg total) by mouth 2 (two) times daily. Patient taking differently: Take 80 mg by mouth daily.  11/23/19  Yes Cirigliano, Vito V, DO  diphenoxylate-atropine (LOMOTIL) 2.5-0.025 MG tablet Take 1 tablet by mouth 4 (four) times daily as needed for diarrhea or loose stools. 12/31/19   Libby Maw, MD    Allergies    Other, Penicillins, and Penicillins  Review of Systems   Review of Systems  All other systems reviewed and are negative.   Physical Exam Updated Vital Signs BP 107/82   Pulse 81   Temp 98.4 F (36.9 C) (Oral)   Resp 18   Ht 5\' 2"  (1.575 m)   Wt 55 kg   SpO2 99%   BMI 22.18 kg/m   Physical Exam Vitals and nursing note reviewed.  Constitutional:      General: She is  not in acute distress.    Appearance: She is well-developed.  HENT:     Head: Normocephalic and atraumatic.  Eyes:     Conjunctiva/sclera: Conjunctivae normal.  Cardiovascular:     Rate and Rhythm: Normal rate and regular rhythm.     Heart sounds: No murmur.  Pulmonary:     Effort: Pulmonary effort is normal. No respiratory distress.     Breath sounds: Normal breath sounds.  Abdominal:     Palpations: Abdomen is soft.     Tenderness: There is no abdominal tenderness.  Musculoskeletal:        General: Normal range of motion.     Cervical back: Neck supple.  Skin:    General: Skin is warm and dry.  Neurological:     Mental Status: She is alert and oriented to person, place, and time.     Comments: CN III-XII intact, speech is clear, movements are goal oriented, sensation and strength is intact throughout  Psychiatric:        Mood and Affect: Mood normal.        Behavior: Behavior normal.     ED Results / Procedures / Treatments   Labs (all labs ordered are listed, but only abnormal results are displayed) Labs Reviewed  CBC WITH DIFFERENTIAL/PLATELET - Abnormal; Notable for the following components:      Result Value   WBC 12.3 (*)    Platelets 459 (*)    Neutro Abs 8.0 (*)    All other components within normal limits  BASIC METABOLIC PANEL - Abnormal; Notable for the following components:   Glucose, Bld 103 (*)    BUN <5 (*)    All other components within normal limits  I-STAT BETA HCG BLOOD, ED ([facility], [facility], [facility] ONLY)    EKG None  Radiology CT Head Wo Contrast  Result Date: 01/02/2020 CLINICAL DATA:  Headache EXAM: CT HEAD WITHOUT CONTRAST TECHNIQUE: Contiguous axial images were obtained from the base of the skull through the vertex without intravenous contrast. COMPARISON:  None. FINDINGS: Brain: No acute intracranial abnormality. Specifically, no hemorrhage, hydrocephalus, mass lesion, acute infarction, or significant intracranial injury. Vascular: No hyperdense vessel  or unexpected calcification. Skull: No acute calvarial abnormality. Sinuses/Orbits: Visualized paranasal sinuses and mastoids clear. Orbital soft tissues unremarkable. Other: None IMPRESSION: Normal study. Electronically Signed   By: Rolm Baptise M.D.   On: 01/02/2020 03:14    Procedures Procedures (including critical care time)  Medications Ordered in ED Medications  ketorolac (TORADOL) 30 MG/ML injection 30 mg (30 mg Intramuscular Given 01/02/20 0256)    ED Course  I have reviewed the triage vital signs and the nursing notes.  Pertinent labs & imaging results that were available during my care of the patient were reviewed by me and considered in my medical decision making (see chart for details).    MDM Rules/Calculators/A&P                      Patient with persistent daily headaches times several weeks.  She has associated nausea, but clarifies that she has had nausea and vomiting almost daily for a decade.  She is noted be afebrile.  She is not in any acute distress.  Will check CT to rule out mass, which if reassuring, I recommend that she keep her appointment with the neurologist.  Her symptoms do seem to have some anxiety component to them.  CT scan is negative.  Doubt emergent process.  Do not feel that any further emergent work-up is indicated.  Patient can follow-up with a neurologist on an outpatient basis.   Final Clinical Impression(s) / ED Diagnoses Final diagnoses:  Nonintractable headache, unspecified chronicity pattern, unspecified headache type    Rx / DC Orders ED Discharge Orders    None       Montine Circle, PA-C [id number]    Delora Fuel, MD [id number] [phone]

## 2020-01-02 NOTE — ED Notes (Signed)
Pt left ER before RN could properly discharge pt.

## 2020-01-03 ENCOUNTER — Telehealth: Payer: Self-pay | Admitting: Gastroenterology

## 2020-01-03 ENCOUNTER — Other Ambulatory Visit: Payer: Self-pay | Admitting: Gastroenterology

## 2020-01-03 NOTE — Telephone Encounter (Signed)
Patient called states she was at ED over the weekend and requested to speak with you

## 2020-01-04 NOTE — Telephone Encounter (Signed)
Pt called returning your call 

## 2020-01-04 NOTE — Telephone Encounter (Signed)
Left message for patient to call back to the office;  

## 2020-01-05 ENCOUNTER — Telehealth (INDEPENDENT_AMBULATORY_CARE_PROVIDER_SITE_OTHER): Payer: BC Managed Care – PPO | Admitting: Nurse Practitioner

## 2020-01-05 ENCOUNTER — Other Ambulatory Visit: Payer: Self-pay

## 2020-01-05 ENCOUNTER — Encounter: Payer: Self-pay | Admitting: Nurse Practitioner

## 2020-01-05 VITALS — Ht 62.0 in

## 2020-01-05 DIAGNOSIS — G44029 Chronic cluster headache, not intractable: Secondary | ICD-10-CM

## 2020-01-05 MED ORDER — RIZATRIPTAN BENZOATE 10 MG PO TABS: 10.0000 mg | ORAL_TABLET | ORAL | 0 refills | Status: DC | PRN

## 2020-01-05 NOTE — Telephone Encounter (Signed)
Called and spoke with patient-patient informed of MD recommendations; patient is agreeable with plan of care and reports she will follow up with PCP; Patient verbalized understanding of information/instructions;  Patient was advised to call the office at [phone] if questions/concerns arise;  Patient has been scheduled with her PCP today at 11:00 am for a tele visit;

## 2020-01-05 NOTE — Progress Notes (Signed)
Virtual Visit via Video Note  I connected with@ on 01/05/20 at 11:00 AM EDT by a video enabled telemedicine application and verified that I am speaking with the correct person using two identifiers.  Location: Patient:Home Provider: Office Participants: patient and provider  I discussed the limitations of evaluation and management by telemedicine and the availability of in person appointments. I also discussed with the patient that there may be a patient responsible charge related to this service. The patient expressed understanding and agreed to proceed.  CC:TOC from Dr. Alesia Morin and Nigel Bridgeman is better since the hospital--this has been going on for a long time per pt--ibuprofen and otc med didnt help with migrains/ FYI--pt mention she has drainage/runny nose since 2019 and that lead to nausesa.   History of Present Illness: Headache  This is a new problem. The current episode started 1 to 4 weeks ago. The problem occurs constantly. The problem has been waxing and waning. The pain is located in the frontal and retro-orbital region. The pain does not radiate. The pain quality is not similar to prior headaches. The quality of the pain is described as dull. Associated symptoms include dizziness and nausea. Pertinent negatives include no blurred vision, drainage, ear pain, eye pain, eye redness, eye watering, insomnia, loss of balance, numbness, phonophobia, photophobia, rhinorrhea, sinus pressure, tinnitus, visual change, vomiting or weakness.  hx of GERD with esophagitis CT head done at ED: normal  normal cbc and BMP 01/01/2020  Observations/Objective: Physical Exam  Constitutional: She is oriented to person, place, and time. No distress.  Eyes: Conjunctivae and EOM are normal.  Pulmonary/Chest: Effort normal.  Musculoskeletal:     Cervical back: Normal range of motion and neck supple.  Neurological: She is alert and oriented to person, place, and time.  Psychiatric: She has a normal  mood and affect.   Assessment and Plan: Vicki Erickson was seen today for establish care.  Diagnoses and all orders for this visit:  Chronic cluster headache, not intractable -     rizatriptan (MAXALT) 10 MG tablet; Take 1 tablet (10 mg total) by mouth as needed for migraine. May repeat in 2 hours if needed. No more than 30mg  in 24hrs   Follow Up Instructions: See avs   I discussed the assessment and treatment plan with the patient. The patient was provided an opportunity to ask questions and all were answered. The patient agreed with the plan and demonstrated an understanding of the instructions.   The patient was advised to call back or seek an in-person evaluation if the symptoms worsen or if the condition fails to improve as anticipated.  Wilfred Lacy, NP

## 2020-01-05 NOTE — Patient Instructions (Addendum)
Avoid NSAIDs due to severe GERD symptoms. Call office if no improvement in 3days. Will consider referral to neurology at that time  Cluster Headache Cluster headaches are deeply painful. They normally occur on one side of your head, but they may switch sides. Often, cluster headaches:  Are severe.  Happen often for a few weeks or months and then go away for a while.  Last from 15 minutes to 3 hours.  Happen at the same time each day.  Happen at night.  Happen many times a day. Follow these instructions at home:        Follow instructions from your doctor to care for yourself at home:  Go to bed at the same time each night. Get the same amount of sleep every night.  Avoid alcohol.  Stop smoking if you smoke. This includes cigarettes and e-cigarettes.  Take over-the-counter and prescription medicines only as told by your doctor.  Do not drive or use heavy machinery while taking prescription pain medicine.  Use oxygen as told by your doctor.  Exercise regularly.  Eat a healthy diet.  Write down when each headache happened, what kind of pain you had, how bad your pain was, and what you tried to help your pain. This is called a headache diary. Use it as told by your doctor. Contact a doctor if:  Your headaches get worse or they happen more often.  Your medicines are not helping. Get help right away if:  You pass out (faint).  You get weak or lose feeling (have numbness) on one side of your body or face.  You see two of everything (double vision).  You feel sick to your stomach (nauseous) or you throw up (vomit), and you do not stop after many hours.  You have trouble with your balance or with walking.  You have trouble talking.  You have neck pain or stiffness.  You have a fever. This information is not intended to replace advice given to you by your health care provider. Make sure you discuss any questions you have with your health care provider. Document  Revised: 07/28/2019 Document Reviewed: 06/07/2016 Elsevier Patient Education  2020 [employer].

## 2020-01-06 ENCOUNTER — Other Ambulatory Visit (HOSPITAL_COMMUNITY): Payer: Self-pay | Admitting: Surgery

## 2020-01-06 ENCOUNTER — Other Ambulatory Visit: Payer: Self-pay | Admitting: Surgery

## 2020-01-06 ENCOUNTER — Ambulatory Visit: Payer: BC Managed Care – PPO | Admitting: Family Medicine

## 2020-01-06 DIAGNOSIS — R1110 Vomiting, unspecified: Secondary | ICD-10-CM

## 2020-01-07 ENCOUNTER — Other Ambulatory Visit: Payer: Self-pay

## 2020-01-07 ENCOUNTER — Ambulatory Visit (HOSPITAL_COMMUNITY)
Admission: RE | Admit: 2020-01-07 | Discharge: 2020-01-07 | Disposition: A | Discharge status: Home / Self Care | Payer: BC Managed Care – PPO | Source: Ambulatory Visit | Attending: Surgery | Admitting: Surgery

## 2020-01-07 DIAGNOSIS — R1110 Vomiting, unspecified: Secondary | ICD-10-CM | POA: Insufficient documentation

## 2020-01-07 MED ORDER — TECHNETIUM TC 99M SULFUR COLLOID: 2.0000 | Freq: Once | INTRAVENOUS | Status: DC | PRN

## 2020-01-10 ENCOUNTER — Telehealth: Payer: Self-pay | Admitting: Gastroenterology

## 2020-01-10 ENCOUNTER — Encounter: Payer: BC Managed Care – PPO | Admitting: Family Medicine

## 2020-01-10 ENCOUNTER — Telehealth: Payer: Self-pay | Admitting: Nurse Practitioner

## 2020-01-10 ENCOUNTER — Ambulatory Visit (HOSPITAL_COMMUNITY)
Admission: RE | Admit: 2020-01-10 | Discharge: 2020-01-10 | Disposition: A | Discharge status: Home / Self Care | Payer: BC Managed Care – PPO | Source: Ambulatory Visit | Attending: Surgery | Admitting: Surgery

## 2020-01-10 ENCOUNTER — Other Ambulatory Visit: Payer: Self-pay

## 2020-01-10 DIAGNOSIS — R1110 Vomiting, unspecified: Secondary | ICD-10-CM | POA: Insufficient documentation

## 2020-01-10 DIAGNOSIS — G44029 Chronic cluster headache, not intractable: Secondary | ICD-10-CM

## 2020-01-10 MED ORDER — RIZATRIPTAN BENZOATE 10 MG PO TABS: 10.0000 mg | ORAL_TABLET | ORAL | 0 refills | Status: DC | PRN

## 2020-01-10 MED ORDER — TOPIRAMATE 25 MG PO TABS: ORAL_TABLET | ORAL | 1 refills | Status: DC

## 2020-01-10 NOTE — Telephone Encounter (Signed)
Was medication helpful or not?

## 2020-01-10 NOTE — Telephone Encounter (Signed)
Charlotte please advise, ok to refill?  Last ov was 01/05/20 sent in #9 tab and mention about neurology referral?

## 2020-01-10 NOTE — Telephone Encounter (Signed)
Pt is aware.  

## 2020-01-10 NOTE — Telephone Encounter (Signed)
Called and spoke with patient-patient reports she had a "bad experience with the radiologist telling me that I do not have a hiatal hernia and my "valve" is messed up -he then said there is not anything wrong with me"-"I am just trying to figure out what I need to do next"  Patient reports she was seen by the surgeon-Dr. Joneen Caraway was told her that until the vomiting is controlled the surgery cannot be done-told surgery was not an option at this time-"after I checked into the office and they forgot that I was there and I waited over an hour for my appt and then they rushed me through the visit with Dr. Kae Heller- RN apologized for the "bad experience" with the sx and patient reports she is already calling the patient complaint department because of this issue with the radiologist at the hospital Please advise of next step in plan of care

## 2020-01-10 NOTE — Telephone Encounter (Signed)
Pt stated this med help but headache still come back, pt feels it is not resolve all together.

## 2020-01-10 NOTE — Telephone Encounter (Signed)
Patient is calling and requesting a refill for rizatriptan sent to [facility] on [address]. CB is [phone]

## 2020-01-10 NOTE — Telephone Encounter (Signed)
maxalt sent. Also sent topamax. Take as directed on package. This is to prevent migraine headache. Also entered referral to neurology.

## 2020-01-10 NOTE — Telephone Encounter (Signed)
LVM for the pt to call back, need to know if this med help?

## 2020-01-10 NOTE — Telephone Encounter (Signed)
I too apologize for the experience that she had in the radiology and surgical clinics.  I was able to review the recent upper GI series and gastric emptying study ordered by Dr. Kae Heller.  Both were essentially normal.  No appreciable hiatal hernia nor refluxate noted on upper GI series.  GES was normal without any delayed emptying.  For now, clinical recommendations are to continue with acid suppression therapy and to follow-up in the GI clinic as already scheduled with me.  There are certainly good data to support holding off on antireflux surgery until nausea/vomiting are controlled, as these symptoms can decrease the efficacy of fundoplication, which we will plan on discussing at follow-up.  We can plan to discuss medical versus surgical options at follow-up appointment.

## 2020-01-11 ENCOUNTER — Encounter: Payer: Self-pay | Admitting: Neurology

## 2020-01-17 ENCOUNTER — Telehealth: Payer: Self-pay | Admitting: Nurse Practitioner

## 2020-01-17 NOTE — Telephone Encounter (Signed)
Please inquire about the purpose of this referral

## 2020-01-17 NOTE — Telephone Encounter (Signed)
Patient is requesting a referral to Dr. Eddie Dibbles Schmeltzer at [facility], phone [phone], fax [phone].

## 2020-01-18 NOTE — Telephone Encounter (Signed)
Pt stated this doctor will be able to do the Endoscopy before 02/23/2020 (pt has appt schedule in Epic for Esophageal Manometry), this need to be done before Dr. Herschell Dimes [facility] doctor) will able to do any test for her GERD symptoms. Pt stated Dr. Halford Chessman require referral from PCP send to them first before they can schedule.   Charlotte please advise, pt request to speak to you personally if you are unable to put in this referral?

## 2020-01-19 ENCOUNTER — Telehealth: Payer: Self-pay | Admitting: Nurse Practitioner

## 2020-01-19 ENCOUNTER — Encounter: Payer: Self-pay | Admitting: Nurse Practitioner

## 2020-01-19 NOTE — Telephone Encounter (Signed)
We checked all over the front and in the shred bins but we don't have any forms for a medical record release and no one remembers one being dropped off. I called medical records because if we had received it we would have faxed it to them but they have no record of it either.

## 2020-01-19 NOTE — Telephone Encounter (Signed)
Pt is aware, appt set for 01/20/2020 with Nche.

## 2020-01-19 NOTE — Telephone Encounter (Addendum)
Ronny please help follow on this--before the appt made pt was very upset that she has to make an appt to discuss referral need, pt does not understand why she has to see Baldo Ash and pay for a visit and pt was using inappropriate language during the conversation "this does not help solve my d--- problem".

## 2020-01-19 NOTE — Telephone Encounter (Signed)
Please schedule video appt to further discuss need for this referral.

## 2020-01-19 NOTE — Telephone Encounter (Addendum)
Patient is calling back and wanted to speak to someone regarding getting her medical records transferred. Patient was very upset and I explained to her that I would send a message back asking about the status of records being transferred. Patient then starting yelling and using inappropriate language. "Patient stated that she was very unhappy with our services and screamed that we didn't know what the h--- we were doing".  Asked patient not to use that language and if she continued then I was going to disconnect the call. Patient continued to yell that she wanting to speak to someone and stated that "this doesn't make any d--- sense that she shouldn't have to come in the office to  have her medical records released". Patient then continue to threaten that she was going to leave the practice and didn't want to see any provider in the office.

## 2020-01-20 ENCOUNTER — Telehealth: Payer: BC Managed Care – PPO | Admitting: Nurse Practitioner

## 2020-01-21 ENCOUNTER — Encounter: Payer: Self-pay | Admitting: Gastroenterology

## 2020-01-21 ENCOUNTER — Other Ambulatory Visit: Payer: Self-pay

## 2020-01-21 ENCOUNTER — Ambulatory Visit (INDEPENDENT_AMBULATORY_CARE_PROVIDER_SITE_OTHER): Payer: BC Managed Care – PPO | Admitting: Gastroenterology

## 2020-01-21 VITALS — BP 100/62 | HR 92 | Temp 98.0°F | Ht 62.0 in | Wt 104.4 lb

## 2020-01-21 DIAGNOSIS — K649 Unspecified hemorrhoids: Secondary | ICD-10-CM

## 2020-01-21 DIAGNOSIS — K449 Diaphragmatic hernia without obstruction or gangrene: Secondary | ICD-10-CM

## 2020-01-21 DIAGNOSIS — K219 Gastro-esophageal reflux disease without esophagitis: Secondary | ICD-10-CM | POA: Diagnosis not present

## 2020-01-21 NOTE — Progress Notes (Signed)
P  Chief Complaint:    Symptomatic Internal Hemorrhoids; Hemorrhoid Band Ligation  GI History: 39 year old femalewith a history of anxiety, depression, ADHD, hypotension, tobacco use,follows in the GI clinic for reflux, diarrhea, and symptomatic hemorrhoids.  1) GERD/Hiatal hernia:Index reflux symptoms ofregurgitation,nausea, chronic cough, raspy voice, increased throat clearing. Nocturnal cough and choking sensation. No associated exacerbating foods.Occasional mucus-like sensation in esophagus, no dysphagia or odynophagia. Has been taking antiemetics for 10-12 years, and was told she had reflux approx 17 years ago. Was seen by ENT and told this was reflux related sxs. Started taking OTC omeprazolebut continuedbreakthrough symptoms. Started on Protonix 40 mg/dayin 1/2020withincomplete response,and increased to 40 mgbidin 11/2018. -EGD (11/2019, Dr. Bryan Lemma): LA GradeBesophagitis, Hill grade 3 valve, mild non-H. pylori gastritis, benign duodenal polyp -GERD-HRQLQuestionnaire score: 27/50, checked "dissatisfied "with current health condition due to reflux -Referred to Dr. Kae Heller at [place] for consideration of cTIF.  Referred for EM, UGI, GES due to emesis -UGI series (12/2019): Normal.  No hernia appreciated.  Partial emesis during swallowing of thick barium -GES (12/2019): Normal -CT head (12/2019): Normal -CT abdomen/pelvis (12/2019): Normal GI and hepatobiliary/pancreas.  2.5 cm cyst or follicle of right ovary  2)Diarrhea: Episodic watery, nonbloody stools for the last 2 years. Occasional scant BRB on tissue paper. Has had stool incontinence. Some improvement with trial of Imodium prn. Was recently started on Lomotil with resolution. But she would like to find etiology rather than ongoing med use. Consumes a good amount of dietary fiber. Has not trialed other meds or fiber supplement, etc. No preceding exposures, travel, Abx, etc. -Negative GI PCR panel -Colonoscopy  (11/2019, Dr. Bryan Lemma): 12 mm polyp (SSP), 4 polyps in rectum and sigmoid (SSP), internal hemorrhoids, normal TI. Otherwise, biopsies negative for MC. Repeat in 3 years  3) Postnasal drainage: Postnasal drip symptoms x1.5-2 years. Was evaluated by ENT at [facility] in 07/2019. Felt postnasal drainage was related to reflux advised Prilosec 20 mg bidand nasal hygiene instructions.  4) Symptomatic hemorrhoids: Intermittent hematochezia, rectal irritation, itching.  Suboptimal response to conservative management. -12/17/2019: Successful banding of RA hemorrhoid -01/21/2020: Presents for hemorrhoid banding #2  HPI:     Patient is a 39 y.o. femalewith a history of symptomatic internal hemorrhoids presenting to the Gastroenterology Clinic for follow-up and ongoing treatment. The patient presents with symptomatic grade 1-2 hemorrhoids, unresponsive to maximal medical therapy, requesting rubber band ligation of symptomatic hemorrhoidal disease.  Did well after the first hemorrhoid banding on 12/17/2019.  No residual symptoms.  Completed CT abdomen/pelvis on 12/20/2019 which was normal from a GI and hepatobiliary standpoint, but did find a 2.5 cm cyst or follicle of the right ovary, potentially contributing to her chronic intermittent RLQ pain.  Placed referral to GYN.  Separately, she recently lost her job.  No change in medical or surgical history, medications, allergies, social history since last appointment with me.   Review of systems:     No chest pain, no SOB, no fevers, no urinary sx   Past Medical History:  Diagnosis Date  . ADHD   . ANEMIA, IRON DEFICIENCY 09/25/2007   Qualifier: Diagnosis of  By: Wynetta Emery LPN, Kim    . Anxiety   . Cystitis 12/22/2018  . Decreased libido 09/17/2019  . Depression   . 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 GERD (gastroesophageal reflux disease) 10/27/2019  . Hypotension 09/17/2019  . PTSD (post-traumatic stress disorder)     Patient's surgical history,  family medical history, social history, medications and allergies were all reviewed  in Epic    Current Outpatient Medications  Medication Sig Dispense Refill  . ALPRAZolam (XANAX) 0.5 MG tablet Take 0.25 mg by mouth at bedtime as needed for anxiety.    . diphenoxylate-atropine (LOMOTIL) 2.5-0.025 MG tablet Take 1 tablet by mouth 4 (four) times daily as needed for diarrhea or loose stools. 30 tablet 0  . pantoprazole (PROTONIX) 40 MG tablet Take 1 tablet by mouth twice daily 112 tablet 0  . rizatriptan (MAXALT) 10 MG tablet Take 1 tablet (10 mg total) by mouth as needed for migraine. May repeat in 2 hours if needed. No more than 30mg  in 24hrs 9 tablet 0  . topiramate (TOPAMAX) 25 MG tablet Take 1 tablet (25 mg total) by mouth at bedtime for 7 days, THEN 1 tablet (25 mg total) 2 (two) times daily for 23 days. 53 tablet 1   No current facility-administered medications for this visit.    Physical Exam:     BP 100/62   Pulse 92   Temp 98 F (36.7 C)   Ht 5\' 2"  (1.575 m)   Wt 104 lb 6 oz (47.3 kg)   BMI 19.09 kg/m   GENERAL:  Pleasant female in NAD PSYCH: : Cooperative, normal affect NEURO: Alert and oriented x 3, no focal neurologic deficits Rectal exam: Sensation intact and preserved anal wink.  Grade 2 hemorrhoid noted in the LL position.  No external anal fissures noted. Normal sphincter tone. No palpable mass. No blood on the exam glove. (Chaperone: Curlene Labrum, CMA).   IMPRESSION and PLAN:    #1.  Symptomatic internal hemorrhoids: PROCEDURE NOTE: The patient presents with symptomatic grade 2 hemorrhoids, unresponsive to maximal medical therapy, requesting rubber band ligation of symptomatic hemorrhoidal disease.  All risks, benefits and alternative forms of therapy were described and informed consent was obtained.  In the Left Lateral Decubitus position, anoscopic examination revealed grade 2 hemorrhoids in the LL position(s).  The anorectum was pre-medicated with  RectiCare. The decision was made to band the LL internal hemorrhoid, and the [place] was used to perform band ligation without complication.  Digital anorectal examination was then performed to assure proper positioning of the band, and to adjust the banded tissue as required.  The patient was discharged home without pain or other issues.  Dietary and behavioral recommendations were given and along with follow-up instructions.     The following adjunctive treatments were recommended:  -Resume high-fiber diet with fiber supplement (i.e. Citrucel or Benefiber) with goal for soft stools without straining to have a BM. -Resume adequate fluid intake.  The patient will return as needed for follow-up and possible additional banding as required. No complications were encountered and the patient tolerated the procedure well.     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 She has previously requested antireflux surgery as a means to improve reflux symptoms and stop or significantly reduce need for acid suppression therapy. Given Hill grade 3 valve,  was referred to Dr. Kae Heller at [place] for consideration of cTIF.  Has been seen by Dr. Kae Heller, with unremarkable UGI and GES.  EM pending. -Follow-up with Dr. Windle Guard after EM complete to again discuss  cTIF.  I will remain available to coordinate date/time for combined procedure.  I sent a message to Dr. Kae Heller today for coordination -Resume Protonix 40 mgbid, taken 30-60 minutes before breakfast/dinner -HOB elevation, avoid eating within 3 hours of bedtime, avoid overeating  4) Chronic diarrhea: -  Improved with high-fiber diet/fiber supplement with Lomotil as needed -Evaluation to date otherwise unrevealing to include colonoscopy GI PCR panel.  -Continue high-fiber diet and fiber supplement as previously discussed  5) Lower abdominal pain/RLQ pain: - CT  abd/pelvis  12/20/2019 with 2.5 cm cyst versus follicle in the right ovary.  Placed referral to GYN.      Lavena Bullion ,DO, FACG 01/21/2020, 8:35 AM

## 2020-01-21 NOTE — Patient Instructions (Signed)
Return to our office as needed  It was a pleasure to see you today!  Vito Cirigliano, D.O.

## 2020-01-24 NOTE — Telephone Encounter (Signed)
Pt was notified that Baldo Ash agreed to continue as PCP but that pt cannot use inappropriate language with staff. Pt asked about me sending records that she requested. Pt was advised again that she could contact medical records for the release. She said she forgot and had done that. Pt was advised again that Dr. Windle Guard referred her for the endoscopy procedure and would need to provide a new referral to the provider of her choice.  Pt displayed understanding of all the above.

## 2020-01-26 ENCOUNTER — Other Ambulatory Visit: Payer: Self-pay

## 2020-01-27 ENCOUNTER — Encounter: Payer: Self-pay | Admitting: Nurse Practitioner

## 2020-01-27 ENCOUNTER — Ambulatory Visit (INDEPENDENT_AMBULATORY_CARE_PROVIDER_SITE_OTHER): Payer: BC Managed Care – PPO | Admitting: Nurse Practitioner

## 2020-01-27 VITALS — BP 122/80 | HR 99 | Temp 97.7°F | Ht 62.0 in | Wt 101.8 lb

## 2020-01-27 DIAGNOSIS — G44029 Chronic cluster headache, not intractable: Secondary | ICD-10-CM

## 2020-01-27 DIAGNOSIS — Z1283 Encounter for screening for malignant neoplasm of skin: Secondary | ICD-10-CM

## 2020-01-27 DIAGNOSIS — Z136 Encounter for screening for cardiovascular disorders: Secondary | ICD-10-CM

## 2020-01-27 DIAGNOSIS — R739 Hyperglycemia, unspecified: Secondary | ICD-10-CM

## 2020-01-27 DIAGNOSIS — Z13220 Encounter for screening for lipoid disorders: Secondary | ICD-10-CM

## 2020-01-27 DIAGNOSIS — Z0000 Encounter for general adult medical examination without abnormal findings: Secondary | ICD-10-CM | POA: Diagnosis not present

## 2020-01-27 LAB — LIPID PANEL
Cholesterol: 129 mg/dL (ref 0–200)
HDL: 37 mg/dL — ABNORMAL LOW (ref >39.00)
LDL Cholesterol: 70 mg/dL (ref 0–99)
NonHDL: 92.44
Total CHOL/HDL Ratio: 3
Triglycerides: 114 mg/dL (ref 0.0–149.0)
VLDL: 22.8 mg/dL (ref 0.0–40.0)

## 2020-01-27 LAB — COMPREHENSIVE METABOLIC PANEL
ALT: 8 U/L (ref 0–35)
AST: 11 U/L (ref 0–37)
Albumin: 4.5 g/dL (ref 3.5–5.2)
Alkaline Phosphatase: 46 U/L (ref 39–117)
BUN: 7 mg/dL (ref 6–23)
CO2: 27 mEq/L (ref 19–32)
Calcium: 9.7 mg/dL (ref 8.4–10.5)
Chloride: 107 mEq/L (ref 96–112)
Creatinine, Ser: 1.01 mg/dL (ref 0.40–1.20)
GFR: 61.62 mL/min (ref >60.00)
Glucose, Bld: 106 mg/dL — ABNORMAL HIGH (ref 70–99)
Potassium: 5.1 mEq/L (ref 3.5–5.1)
Sodium: 138 mEq/L (ref 135–145)
Total Bilirubin: 0.5 mg/dL (ref 0.2–1.2)
Total Protein: 6.9 g/dL (ref 6.0–8.3)

## 2020-01-27 LAB — CBC
HCT: 41 % (ref 36.0–46.0)
Hemoglobin: 13.7 g/dL (ref 12.0–15.0)
MCHC: 33.4 g/dL (ref 30.0–36.0)
MCV: 96.1 fl (ref 78.0–100.0)
Platelets: 457 10*3/uL — ABNORMAL HIGH (ref 150.0–400.0)
RBC: 4.27 Mil/uL (ref 3.87–5.11)
RDW: 14.2 % (ref 11.5–15.5)
WBC: 7.1 10*3/uL (ref 4.0–10.5)

## 2020-01-27 LAB — HEMOGLOBIN A1C: Hgb A1c MFr Bld: 5.9 % (ref 4.6–6.5)

## 2020-01-27 MED ORDER — TOPIRAMATE 25 MG PO TABS: 25.0000 mg | ORAL_TABLET | Freq: Two times a day (BID) | ORAL | 1 refills | Status: DC

## 2020-01-27 NOTE — Progress Notes (Signed)
Subjective:    Patient ID: Vicki Erickson, female    DOB: 04/14/1981, 39 y.o.   MRN: [id number]  Patient presents today for complete physical   HPI Headache: Improved with use of topamax daily and massage therapy  FHx of skin cancer: Melanoma and basal cell She wants referral to dermatology.  Chronic diarrhea and nausea: Weight loss noted Under the care of GI: Dr. Bryan Lemma  Sexual History (orientation,birth control, marital status, STD):single, sexually active, use of OCP, pelvic and breast exam done by GYN per patient.  Depression/Suicide:She reports hx of PTSD, ADHD, and GAD, currently under the care of Genelle Gather (with [facility] neuropsychiatry), current use of alprazolam prn per patient. Anxiety is exacerbated by recent job loss and chronic nausea/vomiting. Depression screen [facility] 2/9 01/27/2020 01/27/2020 10/27/2019  Decreased Interest 2 3 0  Down, Depressed, Hopeless 1 3 0  PHQ - 2 Score 3 6 0  Altered sleeping 1 - -  Tired, decreased energy 1 - -  Change in appetite 1 - -  Feeling bad or failure about yourself  1 - -  Trouble concentrating 3 - -  Moving slowly or fidgety/restless 3 - -  Suicidal thoughts 0 - -  PHQ-9 Score 13 - -  Difficult doing work/chores Somewhat difficult - -   GAD 7 : Generalized Anxiety Score 01/27/2020  Nervous, Anxious, on Edge 3  Control/stop worrying 3  Worry too much - different things 3  Trouble relaxing 3  Restless 3  Easily annoyed or irritable 3  Afraid - awful might happen 3  Total GAD 7 Score 21  Anxiety Difficulty Very difficult   Vision:will schedule  Dental:will schedule  Immunizations: (TDAP, Hep C screen, Pneumovax, Influenza, zoster)  Health Maintenance  Topic Date Due  . Pap Smear  09/29/2010  . Tetanus Vaccine  09/24/2017  . HIV Screening  01/26/2021*  . Flu Shot  05/14/2020  . Colon Cancer Screening  11/22/2022  *Topic was postponed. The date shown is not the original due date.   Diet:  regular.  Weight:  Wt Readings from Last 3 Encounters:  01/27/20 101 lb 12.8 oz (46.2 kg)  01/21/20 104 lb 6 oz (47.3 kg)  01/01/20 121 lb 4.1 oz (55 kg)   Exercise:none  Fall Risk: Fall Risk  01/27/2020 01/05/2020 10/27/2019 09/17/2019  Falls in the past year? 0 1 0 0  Number falls in past yr: 0 1 - -  Injury with Fall? - 0 - -  Follow up - - - Falls evaluation completed   Medications and allergies reviewed with patient and updated if appropriate.  Patient Active Problem List   Diagnosis Date Noted  . Dizziness 11/01/2019  . Educated about COVID-19 virus infection 11/01/2019  . 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 GERD (gastroesophageal reflux disease) 10/27/2019  . Decreased libido 09/17/2019  . Hypotension 09/17/2019  . Deviated nasal septum 08/04/2019  . Laryngopharyngeal reflux 08/04/2019  . Cystitis 12/22/2018  . Bipolar I disorder ([place]) 05/14/2011  . ANEMIA, IRON DEFICIENCY 09/25/2007    Current Outpatient Medications on File Prior to Visit  Medication Sig Dispense Refill  . ALPRAZolam (XANAX) 0.5 MG tablet Take 0.25 mg by mouth at bedtime as needed for anxiety.    . diphenoxylate-atropine (LOMOTIL) 2.5-0.025 MG tablet Take 1 tablet by mouth 4 (four) times daily as needed for diarrhea or loose stools. 30 tablet 0  . pantoprazole (PROTONIX) 40 MG tablet Take 1 tablet by mouth twice daily 112 tablet 0  . rizatriptan (  MAXALT) 10 MG tablet Take 1 tablet (10 mg total) by mouth as needed for migraine. May repeat in 2 hours if needed. No more than 30mg  in 24hrs 9 tablet 0  . topiramate (TOPAMAX) 25 MG tablet Take 1 tablet (25 mg total) by mouth at bedtime for 7 days, THEN 1 tablet (25 mg total) 2 (two) times daily for 23 days. 53 tablet 1   No current facility-administered medications on file prior to visit.    Past Medical History:  Diagnosis Date  . ADHD   . ANEMIA, IRON DEFICIENCY 09/25/2007   Qualifier: Diagnosis of  By: Wynetta Emery LPN, Kim    . Anxiety   . Cystitis  12/22/2018  . Decreased libido 09/17/2019  . Depression   . 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 GERD (gastroesophageal reflux disease) 10/27/2019  . Hypotension 09/17/2019  . PTSD (post-traumatic stress disorder)     History reviewed. No pertinent surgical history.  Social History   Socioeconomic History  . Marital status: Legally Separated    Spouse name: Not on file  . Number of children: Not on file  . Years of education: Not on file  . Highest education level: Not on file  Occupational History  . Not on file  Tobacco Use  . Smoking status: Current Every Day Smoker    Packs/day: 0.50    Types: Cigarettes  . Smokeless tobacco: Never Used  Substance and Sexual Activity  . Alcohol use: Yes    Comment: 1-2 day beer a day  . Drug use: Yes    Types: Marijuana  . Sexual activity: Not on file  Other Topics Concern  . 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    Social Determinants of Health   Financial Resource Strain:   . Difficulty of Paying Living Expenses:   Food Insecurity:   . Worried About Charity fundraiser in the Last Year:   . Arboriculturist in the Last Year:   Transportation Needs:   . Film/video editor (Medical):   Marland Kitchen Lack of Transportation (Non-Medical):   Physical Activity:   . Days of Exercise per Week:   . Minutes of Exercise per Session:   Stress:   . Feeling of Stress :   Social Connections:   . Frequency of Communication with Friends and Family:   . Frequency of Social Gatherings with Friends and Family:   . Attends Religious Services:   . Active Member of Clubs or Organizations:   . Attends Archivist Meetings:   Marland Kitchen Marital Status:     Family History  Problem Relation Age of Onset  . COPD Mother   . Irritable bowel syndrome Mother   . Other Mother        esophageal polyps  . Skin cancer Father   . Irritable bowel syndrome Father   . Other Maternal Grandfather        small cell  . Breast  cancer Paternal Grandmother   . Stomach cancer Paternal Grandmother   . Colon cancer Paternal Grandmother   . Breast cancer Paternal Aunt        great aunt  . Ovarian cancer Maternal Aunt   . Esophageal cancer Neg Hx   . Rectal cancer Neg Hx        Review of Systems  Constitutional: Negative for fever, malaise/fatigue and weight loss.  HENT: Negative for congestion and sore throat.   Eyes:  Negative for visual changes  Respiratory: Negative for cough and shortness of breath.   Cardiovascular: Negative for chest pain, palpitations and leg swelling.  Gastrointestinal: Negative for blood in stool, constipation, diarrhea and heartburn.  Genitourinary: Negative for dysuria, frequency and urgency.  Musculoskeletal: Negative for falls, joint pain and myalgias.  Skin: Negative for rash.  Neurological: Negative for dizziness, sensory change and headaches.  Endo/Heme/Allergies: Does not bruise/bleed easily.  Psychiatric/Behavioral: Negative for depression, substance abuse and suicidal ideas. The patient is not nervous/anxious.     Objective:   Vitals:   01/27/20 0910  BP: 122/80  Pulse: 99  Temp: 97.7 F (36.5 C)  SpO2: 99%    Body mass index is 18.62 kg/m.   Physical Examination:  Physical Exam Vitals reviewed.  Constitutional:      General: She is not in acute distress.    Appearance: She is well-developed.  HENT:     Right Ear: Tympanic membrane, ear canal and external ear normal.     Left Ear: Tympanic membrane, ear canal and external ear normal.  Eyes:     Extraocular Movements: Extraocular movements intact.     Conjunctiva/sclera: Conjunctivae normal.  Cardiovascular:     Rate and Rhythm: Normal rate and regular rhythm.     Pulses: Normal pulses.     Heart sounds: Normal heart sounds.  Pulmonary:     Effort: Pulmonary effort is normal. No respiratory distress.     Breath sounds: Normal breath sounds.  Chest:     Chest wall: No tenderness.  Abdominal:      General: Bowel sounds are normal.     Palpations: Abdomen is soft.  Genitourinary:    Comments: Pelvic and breast exam deferred to GYN per patient Musculoskeletal:        General: Normal range of motion.     Cervical back: Normal range of motion and neck supple.  Lymphadenopathy:     Cervical: No cervical adenopathy.  Neurological:     Mental Status: She is alert and oriented to person, place, and time.     Deep Tendon Reflexes: Reflexes are normal and symmetric.  Psychiatric:        Attention and Perception: Attention normal.        Mood and Affect: Mood is elated.        Speech: Speech is tangential.        Behavior: Behavior is cooperative.        Thought Content: Thought content does not include homicidal or suicidal ideation. Thought content does not include homicidal or suicidal plan.        Cognition and Memory: Cognition normal.        Judgment: Judgment is impulsive.    ASSESSMENT and PLAN: This visit occurred during the SARS-CoV-2 public health emergency.  Safety protocols were in place, including screening questions prior to the visit, additional usage of staff PPE, and extensive cleaning of exam room while observing appropriate contact time as indicated for disinfecting solutions.   Vicki Erickson was seen today for annual exam.  Diagnoses and all orders for this visit:  Preventative health care -     CBC -     Lipid panel -     Comprehensive metabolic panel  Encounter for lipid screening for cardiovascular disease -     Lipid panel  Hyperglycemia -     Hemoglobin A1c  Skin cancer screening -     Ambulatory referral to Dermatology    Bipolar I disorder [facility]) She  reports hx of PTSD, ADHD, and GAD, currently under the care of Genelle Gather (with [facility] neuropsychiatry), current use of alprazolam prn per patient. Anxiety is exacerbated by recent job loss and chronic nausea/vomiting.      Problem List Items Addressed This Visit    None    Visit  Diagnoses    Preventative health care    -  Primary   Relevant Orders   CBC (Completed)   Lipid panel (Completed)   Comprehensive metabolic panel (Completed)   Encounter for lipid screening for cardiovascular disease       Relevant Orders   Lipid panel (Completed)   Hyperglycemia       Relevant Orders   Hemoglobin A1c (Completed)   Skin cancer screening       Relevant Orders   Ambulatory referral to Dermatology      Follow up: Return if symptoms worsen or fail to improve.  Wilfred Lacy, NP

## 2020-01-27 NOTE — Patient Instructions (Addendum)
Stable lab results Continue f/up with GI and neuropsychiatry  You will be contacted to schedule appt with dermatology.  We will obtain last PAP report from GYN.  Preventive Care 37-37 Years Old, Female Preventive care refers to visits with your health care provider and lifestyle choices that can promote health and wellness. This includes:  A yearly physical exam. This may also be called an annual well check.  Regular dental visits and eye exams.  Immunizations.  Screening for certain conditions.  Healthy lifestyle choices, such as eating a healthy diet, getting regular exercise, not using drugs or products that contain nicotine and tobacco, and limiting alcohol use. What can I expect for my preventive care visit? Physical exam Your health care provider will check your:  Height and weight. This may be used to calculate body mass index (BMI), which tells if you are at a healthy weight.  Heart rate and blood pressure.  Skin for abnormal spots. Counseling Your health care provider may ask you questions about your:  Alcohol, tobacco, and drug use.  Emotional well-being.  Home and relationship well-being.  Sexual activity.  Eating habits.  Work and work Statistician.  Method of birth control.  Menstrual cycle.  Pregnancy history. What immunizations do I need?  Influenza (flu) vaccine  This is recommended every year. Tetanus, diphtheria, and pertussis (Tdap) vaccine  You may need a Td booster every 37 years. Varicella (chickenpox) vaccine  You may need this if you have not been vaccinated. Human papillomavirus (HPV) vaccine  If recommended by your health care provider, you may need three doses over 37 months. Measles, mumps, and rubella (MMR) vaccine  You may need at least one dose of MMR. You may also need a second dose. Meningococcal conjugate (MenACWY) vaccine  One dose is recommended if you are age 37-21 years and a first-year college student living in a  residence hall, or if you have one of several medical conditions. You may also need additional booster doses. Pneumococcal conjugate (PCV13) vaccine  You may need this if you have certain conditions and were not previously vaccinated. Pneumococcal polysaccharide (PPSV23) vaccine  You may need one or two doses if you smoke cigarettes or if you have certain conditions. Hepatitis A vaccine  You may need this if you have certain conditions or if you travel or work in places where you may be exposed to hepatitis A. Hepatitis B vaccine  You may need this if you have certain conditions or if you travel or work in places where you may be exposed to hepatitis B. Haemophilus influenzae type b (Hib) vaccine  You may need this if you have certain conditions. You may receive vaccines as individual doses or as more than one vaccine together in one shot (combination vaccines). Talk with your health care provider about the risks and benefits of combination vaccines. What tests do I need?  Blood tests  Lipid and cholesterol levels. These may be checked every 5 years starting at age 37.  Hepatitis C test.  Hepatitis B test. Screening  Diabetes screening. This is done by checking your blood sugar (glucose) after you have not eaten for a while (fasting).  Sexually transmitted disease (STD) testing.  BRCA-related cancer screening. This may be done if you have a family history of breast, ovarian, tubal, or peritoneal cancers.  Pelvic exam and Pap test. This may be done every 3 years starting at age 37. Starting at age 37, this may be done every 5 years if you have  a Pap test in combination with an HPV test. Talk with your health care provider about your test results, treatment options, and if necessary, the need for more tests. Follow these instructions at home: Eating and drinking   Eat a diet that includes fresh fruits and vegetables, whole grains, lean protein, and low-fat dairy.  Take vitamin  and mineral supplements as recommended by your health care provider.  Do not drink alcohol if: ? Your health care provider tells you not to drink. ? You are pregnant, may be pregnant, or are planning to become pregnant.  If you drink alcohol: ? Limit how much you have to 0-1 drink a day. ? Be aware of how much alcohol is in your drink. In the [place] one drink equals one 12 oz bottle of beer (355 mL), one 5 oz glass of wine (148 mL), or one 1 oz glass of hard liquor (44 mL). Lifestyle  Take daily care of your teeth and gums.  Stay active. Exercise for at least 30 minutes on 5 or more days each week.  Do not use any products that contain nicotine or tobacco, such as cigarettes, e-cigarettes, and chewing tobacco. If you need help quitting, ask your health care provider.  If you are sexually active, practice safe sex. Use a condom or other form of birth control (contraception) in order to prevent pregnancy and STIs (sexually transmitted infections). If you plan to become pregnant, see your health care provider for a preconception visit. What's next?  Visit your health care provider once a year for a well check visit.  Ask your health care provider how often you should have your eyes and teeth checked.  Stay up to date on all vaccines. This information is not intended to replace advice given to you by your health care provider. Make sure you discuss any questions you have with your health care provider. Document Revised: 06/11/2018 Document Reviewed: 06/11/2018 Elsevier Patient Education  2020 [employer].

## 2020-01-27 NOTE — Assessment & Plan Note (Signed)
She reports hx of PTSD, ADHD, and GAD, currently under the care of Genelle Gather (with [facility] neuropsychiatry), current use of alprazolam prn per patient. Anxiety is exacerbated by recent job loss and chronic nausea/vomiting.

## 2020-02-01 DIAGNOSIS — N921 Excessive and frequent menstruation with irregular cycle: Secondary | ICD-10-CM | POA: Diagnosis not present

## 2020-02-17 ENCOUNTER — Telehealth: Payer: Self-pay | Admitting: Cardiology

## 2020-02-17 NOTE — Telephone Encounter (Signed)
STAT if HR is under 50 or over 120 (normal HR is 60-100 beats per minute)  1) What is your heart rate?  103  2) Do you have a log of your heart rate readings (document readings)? Range of 40-198  3) Do you have any other symptoms? Adrenaline, shaky feeling, Migraines, poor vision, short term memory loss,   Pt is out of work due to a myriad of health issues. She has a friend who is a Marine scientist and said her fluctuation is HR is not normal.

## 2020-02-17 NOTE — Telephone Encounter (Signed)
Spoke with patient. She reports she was seen in January, chart review confirms she was seen by Dr. Percival Spanish. She reports she did not have recommended testing done due to personal issues which included having two surgical procedures. She is under the care of a neurologist and has been suffering from migraines and diminished memory. She reports she is also under the care of a masseuse which is a requirement for her due to her muscles tensing up and having to be released.   Patient reports she has been passing out while ambulating. She has sweating and her ears ring and she blacks out. Patient has an apple watch and her friend helped set up the breath app. While looking one day she noticed the HR app shows a history of heart rates. In the past week alone she has ranged from 40-198. She does not recall feeling any symptoms to make her check her heart rate but has heard 198 is not good. Educated that the apple watch may or may not be accurate but the best thing to do is to come in for evaluation and we can assess and order testing for patient.   Informed patient if she feels funny or feels her heart racing or pounding to check her HR. Patient educated to call back if she has any symptoms before appointment tomorrow morning. Patient educated on after hours availability and to report to the ER if her HR is high and she is symptomatic. She reports she went to the ER for this in the past and they gave her a "pain shot" and sent her home. Patient to be seen at 9:45am by Coletta Memos.

## 2020-02-17 NOTE — Progress Notes (Addendum)
Cardiology Clinic Note   Patient Name: Vicki Erickson Date of Encounter: 02/18/2020  Primary Care Provider:  Flossie Buffy, NP Primary Cardiologist:  Minus Breeding, MD  Patient Profile    Vicki Erickson 39 year old female presents the clinic today for follow-up evaluation of her dizziness.  Past Medical History    Past Medical History:  Diagnosis Date  . ADHD   . ANEMIA, IRON DEFICIENCY 09/25/2007   Qualifier: Diagnosis of  By: Wynetta Emery LPN, Kim    . Anxiety   . Cystitis 12/22/2018  . Decreased libido 09/17/2019  . Depression   . 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 GERD (gastroesophageal reflux disease) 10/27/2019  . Hypotension 09/17/2019  . PTSD (post-traumatic stress disorder)    History reviewed. No pertinent surgical history.  Allergies  Allergies  Allergen Reactions  . Other Hives    Mint (leave)  . Penicillins     REACTION: lips swell  . Penicillins Swelling    History of Present Illness    Ms. [age] year old female has a PMH of hypotension, GERD, functional diarrhea, anemia, and bipolar disorder.  She was seen by Dr. Percival Spanish on 11/02/2019.  During that time she was having episodes of dizziness and low blood pressure.  She indicated that she had been having dizzy episodes for a long time.  She also indicated that she had noticed she had lower blood pressure entire life.  Her symptoms were not orthostatic except for on rare occasion.  She indicated she had syncope 10 years ago.  She  indicated that her current dizziness was occasional and she would feel off balance.  She noted that her vision would go gray and she would lose her peripheral vision making it look like she was looking through a tunnel.  Her symptoms were improved with squatting down or sitting.  Her symptoms would last for several seconds and then she would recover.  She had occasional heart racing but denied palpitations.  She was able to do activities such as housework and carrying  things.  She was noted to have decreased exercise tolerance and fatigue.  She had not had any prior cardiac work-up.  She denied neck and arm discomfort.  She had no chest pain.  Her thyroid studies and cortisol levels were normal.  Her EKG at that time was sinus rhythm 74 bpm no ST or T wave abnormalities.  It was recommended that she liberalize the salt in her diet and increase her p.o. fluids.  Compression stockings were recommended.  Further testing was not recommended.  Due to her decreased exercise tolerance and exercise stress test was ordered.  This was not completed.  She presents the clinic today for follow-up evaluation and states she has been having episodes of low heart rate.  She has noticed dizziness with these episodes.  They typically last for minutes to hours and are relieved with rest/massage.  She has not had a syncopal event in over 10 years.  She states she is eating well and trying to maintain her fluid intake.  She states that she has had frequent panic/anxiety attacks and she feels that this may be related to her dizziness as well.  I will order a cardiac event monitor, reordered her GXT, have her liberalize her salt in her diet, wear lower extremity support stockings, mindfulness stress reduction sheet given  Today she denies chest pain, shortness of breath, lower extremity edema, fatigue, palpitations, melena, hematuria, hemoptysis, diaphoresis, weakness, presyncope, syncope, orthopnea, and PND.  Home  Medications    Prior to Admission medications   Medication Sig Start Date End Date Taking? Authorizing Provider  ALPRAZolam Duanne Moron) 0.5 MG tablet Take 0.25 mg by mouth at bedtime as needed for anxiety.    [provider]  diphenoxylate-atropine (LOMOTIL) 2.5-0.025 MG tablet Take 1 tablet by mouth 4 (four) times daily as needed for diarrhea or loose stools. 12/31/19   Libby Maw, MD  pantoprazole (PROTONIX) 40 MG tablet Take 1 tablet by mouth twice daily 01/06/20    Cirigliano, Vito V, DO  rizatriptan (MAXALT) 10 MG tablet Take 1 tablet (10 mg total) by mouth as needed for migraine. May repeat in 2 hours if needed. No more than 30mg  in 24hrs 01/10/20   Nche, Charlene Brooke, NP  topiramate (TOPAMAX) 25 MG tablet Take 1 tablet (25 mg total) by mouth 2 (two) times daily. 01/27/20   Nche, Charlene Brooke, NP    Family History    Family History  Problem Relation Age of Onset  . COPD Mother   . Irritable bowel syndrome Mother   . Other Mother        esophageal polyps  . Skin cancer Father   . Irritable bowel syndrome Father   . Other Maternal Grandfather        small cell  . Breast cancer Paternal Grandmother   . Stomach cancer Paternal Grandmother   . Colon cancer Paternal Grandmother   . Breast cancer Paternal Aunt        great aunt  . Ovarian cancer Maternal Aunt   . Esophageal cancer Neg Hx   . Rectal cancer Neg Hx    She indicated that her mother is alive. She indicated that her father is deceased. She indicated that the status of her maternal grandfather is unknown. She indicated that the status of her paternal grandmother is unknown. She indicated that the status of her maternal aunt is unknown. She indicated that the status of her paternal aunt is unknown. She indicated that the status of her neg hx is unknown.  Social History    Social History   Socioeconomic History  . Marital status: Legally Separated    Spouse name: Not on file  . Number of children: Not on file  . Years of education: Not on file  . Highest education level: Not on file  Occupational History  . Not on file  Tobacco Use  . Smoking status: Current Every Day Smoker    Packs/day: 0.50    Types: Cigarettes  . Smokeless tobacco: Never Used  Substance and Sexual Activity  . Alcohol use: Yes    Comment: 1-2 day beer a day  . Drug use: Yes    Types: Marijuana  . Sexual activity: Not on file  Other Topics Concern  . 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    Social Determinants of Health   Financial Resource Strain:   . Difficulty of Paying Living Expenses:   Food Insecurity:   . Worried About Charity fundraiser in the Last Year:   . Arboriculturist in the Last Year:   Transportation Needs:   . Film/video editor (Medical):   Marland Kitchen Lack of Transportation (Non-Medical):   Physical Activity:   . Days of Exercise per Week:   . Minutes of Exercise per Session:   Stress:   . Feeling of Stress :   Social Connections:   . Frequency  of Communication with Friends and Family:   . Frequency of Social Gatherings with Friends and Family:   . Attends Religious Services:   . Active Member of Clubs or Organizations:   . Attends Archivist Meetings:   Marland Kitchen Marital Status:   Intimate Partner Violence:   . Fear of Current or Ex-Partner:   . Emotionally Abused:   Marland Kitchen Physically Abused:   . Sexually Abused:      Review of Systems    General:  No chills, fever, night sweats or weight changes.  Cardiovascular:  No chest pain, dyspnea on exertion, edema, orthopnea, palpitations, paroxysmal nocturnal dyspnea. Dermatological: No rash, lesions/masses Respiratory: No cough, dyspnea Urologic: No hematuria, dysuria Abdominal:   No nausea, vomiting, diarrhea, bright red blood per rectum, melena, or hematemesis Neurologic:  No visual changes, wkns, changes in mental status. All other systems reviewed and are otherwise negative except as noted above.  Physical Exam    VS:  BP 108/75 (BP Location: Right Arm, Patient Position: Standing)   Pulse 90   Temp 98.2 F (36.8 C)   Ht 5\' 2"  (1.575 m)   Wt 101 lb (45.8 kg)   SpO2 96%   BMI 18.47 kg/m  , BMI Body mass index is 18.47 kg/m. GEN: Well nourished, well developed, in no acute distress. HEENT: normal. Neck: Supple, no JVD, carotid bruits, or masses. Cardiac: RRR, no murmurs, rubs, or gallops. No clubbing, cyanosis, edema.  Radials/DP/PT 2+ and equal bilaterally.    Respiratory:  Respirations regular and unlabored, clear to auscultation bilaterally. GI: Soft, nontender, nondistended, BS + x 4. MS: no deformity or atrophy. Skin: warm and dry, no rash. Neuro:  Strength and sensation are intact. Psych: Normal affect.  Accessory Clinical Findings    ECG personally reviewed by me today-normal sinus rhythm right atrial enlargement 92 bpm- No acute changes  EKG 11/02/2019 Normal sinus rhythm 74 bpm  Assessment & Plan   1.  Dizziness-her episodes of dizziness continue to be occasional and brief.  She is not orthostatic today.  Orthostatic blood pressures negative. Liberalize salt Increase p.o. fluids Lower extremity compression stockings Rise from laying or sitting position slowly Order 14 day zio monitor Order BMP and TSH  Exercise intolerance-continues to have fatigue and exercise intolerance with mild physical activity.  Exercise stress test previously ordered. Reorder exercise stress test.  Anxiety-last anxiety attack 2 days ago.  She states that she does have some difficulty managing symptoms.  However, she is still able to perform ADLs and function. Mindfulness stress reduction sheet given Continue to monitor and follow-up with PCP if symptoms worsen.  Follow-up with Dr. Percival Spanish after stress test and ZIO monitor results.    Jossie Ng. Miron Marxen NP-C    02/18/2020, 10:23 AM [facility] [address] Office [phone] Fax [phone]

## 2020-02-18 ENCOUNTER — Ambulatory Visit (INDEPENDENT_AMBULATORY_CARE_PROVIDER_SITE_OTHER): Payer: BC Managed Care – PPO | Admitting: General Practice

## 2020-02-18 ENCOUNTER — Encounter: Payer: Self-pay | Admitting: Cardiology

## 2020-02-18 ENCOUNTER — Telehealth: Payer: Self-pay | Admitting: Radiology

## 2020-02-18 ENCOUNTER — Encounter: Payer: Self-pay | Admitting: General Practice

## 2020-02-18 ENCOUNTER — Other Ambulatory Visit: Payer: Self-pay

## 2020-02-18 VITALS — BP 108/75 | HR 90 | Temp 98.2°F | Ht 62.0 in | Wt 101.0 lb

## 2020-02-18 DIAGNOSIS — R6889 Other general symptoms and signs: Secondary | ICD-10-CM

## 2020-02-18 DIAGNOSIS — R42 Dizziness and giddiness: Secondary | ICD-10-CM

## 2020-02-18 NOTE — Patient Instructions (Signed)
Medication Instructions:  Your Physician recommend you continue on your current medication as directed.    *If you need a refill on your cardiac medications before your next appointment, please call your pharmacy*   Lab Work: Your physician recommends that you return for lab work today ( TSH, BMP)  If you have labs (blood work) drawn today and your tests are completely normal, you will receive your results only by: Marland Kitchen MyChart Message (if you have MyChart) OR . A paper copy in the mail If you have any lab test that is abnormal or we need to change your treatment, we will call you to review the results.   Testing/Procedures: Your physician has requested that you have an exercise tolerance test. For further information please visit [email or web address]. Please also follow instruction sheet, as given. [place]   You will need to have the coronavirus test completed prior to your procedure. This is a Drive Up Visit at the [employer] [address] Someone will direct you to the appropriate testing line. Please tell them that you are there for procedure testing. Stay in your car and someone will be with you shortly. Please make sure to have all other labs completed before this test because you will need to stay quarantined until your procedure.  Our physician has recommended that you wear an  14 DAY ZIO-PATCH monitor. The Zio patch cardiac monitor continuously records heart rhythm data for up to 14 days, this is for patients being evaluated for multiple types heart rhythms. For the first 24 hours post application, please avoid getting the Zio monitor wet in the shower or by excessive sweating during exercise. After that, feel free to carry on with regular activities. Keep soaps and lotions away from the ZIO XT Patch.   Someone from our office will call to have monitor mailed    Follow-Up: At [facility], you and your health needs are our priority.  As part of  our continuing mission to provide you with exceptional heart care, we have created designated Provider Care Teams.  These Care Teams include your primary Cardiologist (physician) and Advanced Practice Providers (APPs -  Physician Assistants and Nurse Practitioners) who all work together to provide you with the care you need, when you need it.  We recommend signing up for the patient portal called "MyChart".  Sign up information is provided on this After Visit Summary.  MyChart is used to connect with patients for Virtual Visits (Telemedicine).  Patients are able to view lab/test results, encounter notes, upcoming appointments, etc.  Non-urgent messages can be sent to your provider as well.   To learn more about what you can do with MyChart, go to [email or web address].    Your next appointment:   6-8 week(s)  The format for your next appointment:   In Person  Provider:   Minus Breeding, MD   Your provided recommended you do the following: -Increase fluids -Wear compression stockings -Increase salt intake    [facility] at [facility] [address], [place], [place] [address] Phone:  [phone]        You are scheduled for an Exercise Stress Test on   Please arrive 15 minutes prior to your appointment time for registration and insurance purposes.  The test will take approximately 45 minutes to complete.  How to prepare for your Exercise Stress Test: . Do bring a list of your current medications with you.  If not listed below,  you may take your medications as normal. . Do wear comfortable clothes (no dresses or overalls) and walking shoes, tennis shoes preferred (no heels or open toed shoes are allowed) . Do Not wear cologne, perfume, aftershave or lotions (deodorant is allowed). . Please report to [place], [address] for your test.  If these instructions are not followed, your test will have to be rescheduled.  If you  have questions or concerns about your appointment, you can call the Stress Lab at [phone].  If you cannot keep your appointment, please provide 24 hours notification to the Stress Lab, to avoid a possible $50 charge to your account   ZIO XT- Long Term Monitor Instructions   Your physician has requested you wear your ZIO patch monitor___14____days.   This is a single patch monitor.  [employer] supplies one patch monitor per enrollment.  Additional stickers are not available.   Please do not apply patch if you will be having a Nuclear Stress Test, Echocardiogram, Cardiac CT, MRI, or Chest Xray during the time frame you would be wearing the monitor. The patch cannot be worn during these tests.  You cannot remove and re-apply the ZIO XT patch monitor.   Your ZIO patch monitor will be sent USPS Priority mail from [facility] directly to your home address. The monitor may also be mailed to a PO BOX if home delivery is not available.   It may take 3-5 days to receive your monitor after you have been enrolled.   Once you have received you monitor, please review enclosed instructions.  Your monitor has already been registered assigning a specific monitor serial # to you.   Applying the monitor   Shave hair from upper left chest.   Hold abrader disc by orange tab.  Rub abrader in 40 strokes over left upper chest as indicated in your monitor instructions.   Clean area with 4 enclosed alcohol pads .  Use all pads to assure are is cleaned thoroughly.  Let dry.   Apply patch as indicated in monitor instructions.  Patch will be place under collarbone on left side of chest with arrow pointing upward.   Rub patch adhesive wings for 2 minutes.Remove white label marked "1".  Remove white label marked "2".  Rub patch adhesive wings for 2 additional minutes.   While looking in a mirror, press and release button in center of patch.  A small green light will flash 3-4 times .  This will be your only  indicator the monitor has been turned on.     Do not shower for the first 24 hours.  You may shower after the first 24 hours.   Press button if you feel a symptom. You will hear a small click.  Record Date, Time and Symptom in the Patient Log Book.   When you are ready to remove patch, follow instructions on last 2 pages of Patient Log Book.  Stick patch monitor onto last page of Patient Log Book.   Place Patient Log Book in [place] box.  Use locking tab on box and tape box closed securely.  The [employer] has [employer] on it.  Please place in mailbox as soon as possible.  Your physician should have your test results approximately 7 days after the monitor has been mailed back to [facility].   Call [employer] at [phone] if you have questions regarding your ZIO XT patch monitor.  Call them immediately if you see  an orange light blinking on your monitor.   If your monitor falls off in less than 4 days contact our Monitor department at [phone].  If your monitor becomes loose or falls off after 4 days call [employer] at [phone] for suggestions on securing your monitor.     Mindfulness-Based Stress Reduction Mindfulness-based stress reduction (MBSR) is a program that helps people learn to practice mindfulness. Mindfulness is the practice of intentionally paying attention to the present moment. It can be learned and practiced through techniques such as education, breathing exercises, meditation, and yoga. MBSR includes several mindfulness techniques in one program. MBSR works best when you understand the treatment, are willing to try new things, and can commit to spending time practicing what you learn. MBSR training may include learning about:  How your emotions, thoughts, and reactions affect your body.  New ways to respond to things that cause negative thoughts to start (triggers).  How to notice your thoughts and let go of them.  Practicing  awareness of everyday things that you normally do without thinking.  The techniques and goals of different types of meditation. What are the benefits of MBSR? MBSR can have many benefits, which include helping you to:  Develop self-awareness. This refers to knowing and understanding yourself.  Learn skills and attitudes that help you to participate in your own health care.  Learn new ways to care for yourself.  Be more accepting about how things are, and let things go.  Be less judgmental and approach things with an open mind.  Be patient with yourself and trust yourself more. MBSR has also been shown to:  Reduce negative emotions, such as depression and anxiety.  Improve memory and focus.  Change how you sense and approach pain.  Boost your body's ability to fight infections.  Help you connect better with other people.  Improve your sense of well-being. Follow these instructions at home:   Find a local in-person or online MBSR program.  Set aside some time regularly for mindfulness practice.  Find a mindfulness practice that works best for you. This may include one or more of the following: ? Meditation. Meditation involves focusing your mind on a certain thought or activity. ? Breathing awareness exercises. These help you to stay present by focusing on your breath. ? Body scan. For this practice, you lie down and pay attention to each part of your body from head to toe. You can identify tension and soreness and intentionally relax parts of your body. ? Yoga. Yoga involves stretching and breathing, and it can improve your ability to move and be flexible. It can also provide an experience of testing your body's limits, which can help you release stress. ? Mindful eating. This way of eating involves focusing on the taste, texture, color, and smell of each bite of food. Because this slows down eating and helps you feel full sooner, it can be an important part of a weight-loss  plan.  Find a podcast or recording that provides guidance for breathing awareness, body scan, or meditation exercises. You can listen to these any time when you have a free moment to rest without distractions.  Follow your treatment plan as told by your health care provider. This may include taking regular medicines and making changes to your diet or lifestyle as recommended. How to practice mindfulness To do a basic awareness exercise:  Find a comfortable place to sit.  Pay attention to the present moment. Observe your thoughts, feelings, and surroundings just  as they are.  Avoid placing judgment on yourself, your feelings, or your surroundings. Make note of any judgment that comes up, and let it go.  Your mind may wander, and that is okay. Make note of when your thoughts drift, and return your attention to the present moment. To do basic mindfulness meditation:  Find a comfortable place to sit. This may include a stable chair or a firm floor cushion. ? Sit upright with your back straight. Let your arms fall next to your side with your hands resting on your legs. ? If sitting in a chair, rest your feet flat on the floor. ? If sitting on a cushion, cross your legs in front of you.  Keep your head in a neutral position with your chin dropped slightly. Relax your jaw and rest the tip of your tongue on the roof of your mouth. Drop your gaze to the floor. You can close your eyes if you like.  Breathe normally and pay attention to your breath. Feel the air moving in and out of your nose. Feel your belly expanding and relaxing with each breath.  Your mind may wander, and that is okay. Make note of when your thoughts drift, and return your attention to your breath.  Avoid placing judgment on yourself, your feelings, or your surroundings. Make note of any judgment or feelings that come up, let them go, and bring your attention back to your breath.  When you are ready, lift your gaze or open  your eyes. Pay attention to how your body feels after the meditation. Where to find more information You can find more information about MBSR from:  Your health care provider.  Community-based meditation centers or programs.  Programs offered near you. Summary  Mindfulness-based stress reduction (MBSR) is a program that teaches you how to intentionally pay attention to the present moment. It is used with other treatments to help you cope better with daily stress, emotions, and pain.  MBSR focuses on developing self-awareness, which allows you to respond to life stress without judgment or negative emotions.  MBSR programs may involve learning different mindfulness practices, such as breathing exercises, meditation, yoga, body scan, or mindful eating. Find a mindfulness practice that works best for you, and set aside time for it on a regular basis. This information is not intended to replace advice given to you by your health care provider. Make sure you discuss any questions you have with your health care provider. Document Revised: 09/12/2017 Document Reviewed: 02/06/2017 Elsevier Patient Education  [place].

## 2020-02-18 NOTE — Telephone Encounter (Signed)
Enrolled patient for a 14 day Zio monitor to be mailed to patients home.  

## 2020-02-19 ENCOUNTER — Other Ambulatory Visit (HOSPITAL_COMMUNITY)
Admission: RE | Admit: 2020-02-19 | Discharge: 2020-02-19 | Disposition: A | Discharge status: Home / Self Care | Payer: BC Managed Care – PPO | Source: Ambulatory Visit | Attending: Gastroenterology | Admitting: Gastroenterology

## 2020-02-19 DIAGNOSIS — Z20822 Contact with and (suspected) exposure to covid-19: Secondary | ICD-10-CM | POA: Diagnosis not present

## 2020-02-19 DIAGNOSIS — Z01812 Encounter for preprocedural laboratory examination: Secondary | ICD-10-CM | POA: Insufficient documentation

## 2020-02-19 LAB — BASIC METABOLIC PANEL
BUN/Creatinine Ratio: 9 (ref 9–23)
BUN: 8 mg/dL (ref 6–20)
CO2: 22 mmol/L (ref 20–29)
Calcium: 9.3 mg/dL (ref 8.7–10.2)
Chloride: 105 mmol/L (ref 96–106)
Creatinine, Ser: 0.92 mg/dL (ref 0.57–1.00)
GFR calc Af Amer: 92 mL/min/{1.73_m2} (ref >59)
GFR calc non Af Amer: 80 mL/min/{1.73_m2} (ref >59)
Glucose: 78 mg/dL (ref 65–99)
Potassium: 4.9 mmol/L (ref 3.5–5.2)
Sodium: 140 mmol/L (ref 134–144)

## 2020-02-19 LAB — SARS CORONAVIRUS 2 (TAT 6-24 HRS): SARS Coronavirus 2: NEGATIVE

## 2020-02-19 LAB — TSH: TSH: 1.67 u[IU]/mL (ref 0.450–4.500)

## 2020-02-21 ENCOUNTER — Telehealth: Payer: Self-pay | Admitting: Cardiology

## 2020-02-21 NOTE — Telephone Encounter (Signed)
I spoke to the patient who has some questions about possible options for a CoVid Screening.  She is unhappy with the [facility] facility test.

## 2020-02-21 NOTE — Telephone Encounter (Signed)
Left message for patient to call back  

## 2020-02-21 NOTE — Telephone Encounter (Signed)
New Message  Patient is calling in to discuss the covid test that is required for her ETT. Please give a call back to discuss.

## 2020-02-22 ENCOUNTER — Other Ambulatory Visit (INDEPENDENT_AMBULATORY_CARE_PROVIDER_SITE_OTHER): Payer: BC Managed Care – PPO

## 2020-02-22 DIAGNOSIS — R42 Dizziness and giddiness: Secondary | ICD-10-CM | POA: Diagnosis not present

## 2020-02-23 ENCOUNTER — Inpatient Hospital Stay (HOSPITAL_COMMUNITY): Admission: RE | Admit: 2020-02-23 | Payer: Self-pay | Source: Ambulatory Visit

## 2020-02-23 ENCOUNTER — Encounter (HOSPITAL_COMMUNITY)
Admission: RE | Disposition: A | Discharge status: Home / Self Care | Payer: Self-pay | Source: Home / Self Care | Attending: Gastroenterology

## 2020-02-23 ENCOUNTER — Ambulatory Visit (HOSPITAL_COMMUNITY)
Admission: RE | Admit: 2020-02-23 | Discharge: 2020-02-23 | Disposition: A | Discharge status: Home / Self Care | Payer: BC Managed Care – PPO | Attending: Gastroenterology | Admitting: Gastroenterology

## 2020-02-23 DIAGNOSIS — K219 Gastro-esophageal reflux disease without esophagitis: Secondary | ICD-10-CM | POA: Diagnosis not present

## 2020-02-23 DIAGNOSIS — R1110 Vomiting, unspecified: Secondary | ICD-10-CM | POA: Diagnosis present

## 2020-02-23 HISTORY — PX: PH IMPEDANCE STUDY: SHX5565

## 2020-02-23 HISTORY — PX: ESOPHAGEAL MANOMETRY: SHX5429

## 2020-02-23 SURGERY — MANOMETRY, ESOPHAGUS
Anesthesia: Choice

## 2020-02-23 MED ORDER — LIDOCAINE VISCOUS HCL 2 % MT SOLN
OROMUCOSAL | Status: AC
  Filled 2020-02-23: qty 15

## 2020-02-23 SURGICAL SUPPLY — 2 items
FACESHIELD LNG OPTICON STERILE (SAFETY) IMPLANT
GLOVE BIO SURGEON STRL SZ8 (GLOVE) ×6 IMPLANT

## 2020-02-23 NOTE — Progress Notes (Signed)
Esophageal manometery performed without complication.  Patient tolerated well.  Ph probed placed per protocol a 38 cm without complication.  Patient tolerated well.  Instructions given on ph probe..  Patient verbalized understanding.  Patient will return tomorrow at end of study to have probe removed.

## 2020-02-25 ENCOUNTER — Encounter: Payer: Self-pay | Admitting: *Deleted

## 2020-02-25 NOTE — Telephone Encounter (Signed)
Follow Up:   Pt is returning Kayla's call from 02-21-20

## 2020-02-25 NOTE — Telephone Encounter (Signed)
I spoke to the patient who is scheduled for a CoVid screen on Monday 5/17 and is reluctant to go to [facility] and would like to discuss with Kayla on Monday morning to seek alternatives.  I told her that I would forward to her and she will await call on Monday.

## 2020-02-28 ENCOUNTER — Other Ambulatory Visit (HOSPITAL_COMMUNITY): Payer: Self-pay

## 2020-02-28 ENCOUNTER — Telehealth: Payer: Self-pay | Admitting: General Practice

## 2020-02-28 ENCOUNTER — Encounter: Payer: Self-pay | Admitting: Nurse Practitioner

## 2020-02-28 DIAGNOSIS — R0602 Shortness of breath: Secondary | ICD-10-CM

## 2020-02-28 DIAGNOSIS — R6889 Other general symptoms and signs: Secondary | ICD-10-CM

## 2020-02-28 DIAGNOSIS — R42 Dizziness and giddiness: Secondary | ICD-10-CM

## 2020-02-28 NOTE — Telephone Encounter (Signed)
Vicki Erickson is calling stating she cannot get the Covid test that is scheduled for today due to other medical conditions and states there will have to be another way to test her for her stress test. Please advise.

## 2020-02-28 NOTE — Telephone Encounter (Signed)
Spoke with patient. She reports she has post nasal sinus drip and the cotton swab is not going to work for her. Informed patient we do not have alternative testing at this time and in order to have the POET she has to undergo a nasal Covid screening. Patient again stated she knows people can be testing in other ways and wants to speak to someone who knows more than current nurse about testing and other alternatives. Routed to supervisor to review.

## 2020-02-28 NOTE — Telephone Encounter (Signed)
Spoke with patient and she stated she does not want to go to [facility] for another Covid test Per patient she had her last Covid test at drive thru facility it was very uncomfortable and it has irritated her sinuses which she is already having issues with.   She has had Covid tests at [facility] and other places without the discomfort and issues, is willing to go elsewhere  Will forward to Otho Perl for review

## 2020-02-29 NOTE — Telephone Encounter (Signed)
Made pt aware of the cost at discounted rate, as her insurance expired on 4/30, but she is updating her insurance that will be retroactive but she has not finished the paperwork. She would like to wait to schedule the Nuclear Test until she has everything setup. She was given the below discounted rates for the testing ordered and encouraged if run retroactively through her new insurance the rate could change. Nuclear: Est $500 and GXT est: $45-$50. Told her orders were placed and she can call to schedule Nuclear when she is read.Pt has no additional questions at this time.

## 2020-02-29 NOTE — Addendum Note (Signed)
Addended by: Newt Minion on: 02/29/2020 04:03 PM   Modules accepted: Orders

## 2020-02-29 NOTE — Telephone Encounter (Signed)
Spoke with patient to make her aware that she would have to have a nasal swab in order to compelet the test of GXT. She stated she has to much going on that she will not get this test done as she is interested in getting another COVID test. I informed her that she can get a Nuclear Stress test and educated on this test and she would not need COVID test but this may not be covered by her insurance. She asked that I find out from billing if not covered by her insurance what is the cost she would have to pay and she will schedule accordingly at that time. I canceled, per her request, the GXT for Friday 5/21. I informed her that someone will be back in touch to let her know amount and she state she will schedule at that time.

## 2020-02-29 NOTE — Telephone Encounter (Signed)
  This encounter was created in error - please disregard. Error Duplicate encounter

## 2020-03-03 ENCOUNTER — Other Ambulatory Visit: Payer: Self-pay | Admitting: Nurse Practitioner

## 2020-03-03 ENCOUNTER — Encounter: Payer: Self-pay | Admitting: Nurse Practitioner

## 2020-03-03 ENCOUNTER — Inpatient Hospital Stay (HOSPITAL_COMMUNITY): Admission: RE | Admit: 2020-03-03 | Payer: Self-pay | Source: Ambulatory Visit

## 2020-03-03 ENCOUNTER — Telehealth: Payer: Self-pay | Admitting: Gastroenterology

## 2020-03-03 DIAGNOSIS — G44029 Chronic cluster headache, not intractable: Secondary | ICD-10-CM

## 2020-03-03 NOTE — Telephone Encounter (Signed)
Dr. Ron Parker office at [place] called requesting a copy of pt's 24 hr [place] study. Pls fax it to [phone].

## 2020-03-03 NOTE — Telephone Encounter (Signed)
Faxed 24 hr PH study to Dr Windle Guard at [phone]

## 2020-03-06 ENCOUNTER — Telehealth: Payer: Self-pay | Admitting: Gastroenterology

## 2020-03-09 ENCOUNTER — Other Ambulatory Visit: Payer: Self-pay

## 2020-03-09 ENCOUNTER — Encounter: Payer: Self-pay | Admitting: Nurse Practitioner

## 2020-03-09 ENCOUNTER — Telehealth (INDEPENDENT_AMBULATORY_CARE_PROVIDER_SITE_OTHER): Payer: BC Managed Care – PPO | Admitting: Nurse Practitioner

## 2020-03-09 VITALS — Ht 62.0 in | Wt 100.0 lb

## 2020-03-09 DIAGNOSIS — F39 Unspecified mood [affective] disorder: Secondary | ICD-10-CM | POA: Diagnosis not present

## 2020-03-09 DIAGNOSIS — G44029 Chronic cluster headache, not intractable: Secondary | ICD-10-CM

## 2020-03-09 MED ORDER — RIZATRIPTAN BENZOATE 10 MG PO TABS: 10.0000 mg | ORAL_TABLET | ORAL | 0 refills | Status: DC | PRN

## 2020-03-09 MED ORDER — BUSPIRONE HCL 7.5 MG PO TABS: 7.5000 mg | ORAL_TABLET | Freq: Three times a day (TID) | ORAL | 1 refills | Status: DC

## 2020-03-09 NOTE — Assessment & Plan Note (Signed)
She requested for this diagnosis to be removed from her chart. She stated she was misdiagnosed.

## 2020-03-09 NOTE — Telephone Encounter (Signed)
Spoke to patient to let her know that Dr Bryan Lemma is out of the office this week. I called [facility] and spoke with Dr Amado Coe nurse who stated that  Dr Kae Heller sent Dr C a message regarding patients office visit and at this time they are still trying to figure out a plan of care. Patient will be notified next week with results and what her next plan of care will be. All questions answered. Patient voiced understanding.

## 2020-03-09 NOTE — Progress Notes (Addendum)
Virtual Visit via Video Note  I connected with@ on 03/09/20 at 11:30 AM EDT by telephone enabled telemedicine application and verified that I am speaking with the correct person using two identifiers. Unable to connect by video due to Epic malfunction (system was down)  Location: Patient:Home Provider: Office Participants: patient and provider  I discussed the limitations of evaluation and management by telemedicine and the availability of in person appointments. I also discussed with the patient that there may be a patient responsible charge related to this service. The patient expressed understanding and agreed to proceed.  CC: medication discussion//self pay//needs refills for Maxalt and Xanax/no Covid vaccine  History of Present Illness: Ms. Patillo states she has an upcoming appt with psychiatry on 03/24/2020. She also has ongoing therapy sessions (monthly) at [facility] Per PMP database alprazolam was last filled 06/10/2019. Amphetamine was also last filled 09/14/2019. Today she reports increased anxiety due to multiple medical appts, and job loss. She denies ani SI or HI. She is not happy that the diagnosis was put in her chart. She thinks she was misdiagnosed and will like for diagnosis to be removed. She states her rapid speech is due to increased anxiety and does not think she has any psychiatric disorder. She does not want to take any medication of daily basis and is concerned about oversedation as potential side effect. Headaches: no improvement with topamax 25mg  BID, some relief with maxalt and tylenol. Needs maxalt refilled, last filled 12/2019. She has upcoming appt with neurology 03/16/2020.   Observations/Objective: Physical Exam  Constitutional: She is oriented to person, place, and time.  Neurological: She is alert and oriented to person, place, and time.  Psychiatric: Her speech is rapid and/or pressured. She is not hyperactive. Cognition and memory are normal. She  expresses no homicidal and no suicidal ideation.   Assessment and Plan: Kimiye was seen today for follow-up.  Diagnoses and all orders for this visit:  Mood disorder ([place]) -     busPIRone (BUSPAR) 7.5 MG tablet; Take 1 tablet (7.5 mg total) by mouth 3 (three) times daily.  Chronic cluster headache, not intractable -     rizatriptan (MAXALT) 10 MG tablet; Take 1 tablet (10 mg total) by mouth as needed for migraine. May repeat in 2 hours if needed. No more than 30mg  in 24hrs   Follow Up Instructions: Advised Ms. Elainna that I do not prescribe or recommend log term use of benzodiazepine. I recommended to maintain upcoming appt with psychiatry Start buspar. Advised about possible side effect: dizziness She is to also maintain upcoming appt with neurology. maxalt refill   I discussed the assessment and treatment plan with the patient. The patient was provided an opportunity to ask questions and all were answered. The patient agreed with the plan and demonstrated an understanding of the instructions.   The patient was advised to call back or seek an in-person evaluation if the symptoms worsen or if the condition fails to improve as anticipated.  I provided 16 minutes of non-face-to-face time during this encounter.   Wilfred Lacy, NP

## 2020-03-15 NOTE — Progress Notes (Signed)
NEUROLOGY CONSULTATION NOTE  Vicki Erickson MRN: [id number] DOB: 02-02-1981  Referring provider: Flossie Buffy, NP Primary care provider: Flossie Buffy, NP  Reason for consult:  headache  HISTORY OF PRESENT ILLNESS: Vicki Erickson is a 39 year old right-handed female with Bipolar I disorder, PTSD, depression who presents for headaches.  History supplemented by ED and referring provider's notes.  CT head personally reviewed.  She has had dizzy/presyncopal spells since childhood.  She recorded her heart rate on her Apple watch fluctuating from 41 to 198 bpm.  For 10 years, she has had chronic nausea and vomiting.  She was diagnosed with severe GERD and hiatal hernia.  Since February, she reports persistent daily headache.  No previous headache history.  It is a 0000000 holocephalic (various location) throbbing and pressure like pain. She has neck pain. Some photophobia but really no associated symptoms specific to the headache.  She cannot take NSAIDs due to her GERD.  She takes Midol and rizatriptan, which both provide modest relief.  She has been on topiramate since February with no improvement.  She reports that she has been bedridden.  She also reports trouble with concentrating and trouble reading and writing.  She has not been able to work.  She reports significant emotional stress.  She was evaluated in the [facility] ED on 01/01/2020.  CT head was negative for mass lesion or other acute intracranial abnormality.  She was treated with IM Toradol.    Current NSAIDS:  none Current analgesics:  Midol Current triptans:  Maxalt 10mg  Current ergotamine:  none Current anti-emetic:  none Current muscle relaxants:  none Current anti-anxiolytic:  Buspirone 7.5mg  TID Current sleep aide:  none Current Antihypertensive medications:  none Current Antidepressant medications:  none Current Anticonvulsant medications:  topiramate 25mg  twice daily (since February)  Current anti-CGRP:   none Current Vitamins/Herbal/Supplements:  none Current Antihistamines/Decongestants:  none Other therapy:  Massage therapy Hormone/birth control:  none  Past NSAIDS:  none Past analgesics:  Excedrin Migraine; Tylenol Past abortive triptans:  none Past abortive ergotamine:  none Past muscle relaxants:  none Past anti-emetic:  Promethazine 25mg  Past antihypertensive medications:  none Past antidepressant medications:  fluoxetine Past anticonvulsant medications:  lamotrigine Past anti-CGRP:  none Past vitamins/Herbal/Supplements:  none Past antihistamines/decongestants:  none Other past therapies:  none  Drinks 1 cup of coffee daily.  01/27/2020 LABS:  CBC with WBC 7.1, HGB 13, HCT, PLT 457; Hepatic panel with t bili 0.5, ALP 46, AST 11, ALT 8. 02/19/2020 LABS:  BMP with Na 140, K 4.9, Cl 105, CO2 22, Ca 9.3, glucose 78, BUN 8, Cr 0.92; TSH 1.670   PAST MEDICAL HISTORY: Past Medical History:  Diagnosis Date  . ADHD   . ANEMIA, IRON DEFICIENCY 09/25/2007   Qualifier: Diagnosis of  By: Wynetta Emery LPN, Kim    . Anxiety   . Bipolar I disorder ([place]) 05/14/2011  . Cystitis 12/22/2018  . Decreased libido 09/17/2019  . Depression   . 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 GERD (gastroesophageal reflux disease) 10/27/2019  . Hypotension 09/17/2019  . PTSD (post-traumatic stress disorder)     PAST SURGICAL HISTORY: Past Surgical History:  Procedure Laterality Date  . ESOPHAGEAL MANOMETRY N/A 02/23/2020   Procedure: ESOPHAGEAL MANOMETRY (EM);  Surgeon: Mauri Pole, MD;  Location: [facility] ENDOSCOPY;  Service: Endoscopy;  Laterality: N/A;  . [place] IMPEDANCE STUDY N/A 02/23/2020   Procedure: [place] IMPEDANCE STUDY;  Surgeon: Mauri Pole, MD;  Location: [facility] ENDOSCOPY;  Service:  Endoscopy;  Laterality: N/A;    MEDICATIONS: Current Outpatient Medications on File Prior to Visit  Medication Sig Dispense Refill  . ALPRAZolam (XANAX) 0.5 MG tablet Take 0.25 mg by mouth at bedtime as needed for  anxiety.    . busPIRone (BUSPAR) 7.5 MG tablet Take 1 tablet (7.5 mg total) by mouth 3 (three) times daily. 90 tablet 1  . pantoprazole (PROTONIX) 40 MG tablet Take 1 tablet by mouth twice daily 112 tablet 0  . rizatriptan (MAXALT) 10 MG tablet Take 1 tablet (10 mg total) by mouth as needed for migraine. May repeat in 2 hours if needed. No more than 30mg  in 24hrs 10 tablet 0  . topiramate (TOPAMAX) 25 MG tablet Take 1 tablet (25 mg total) by mouth 2 (two) times daily. 180 tablet 1   No current facility-administered medications on file prior to visit.    ALLERGIES: Allergies  Allergen Reactions  . Other Hives    Mint (leave)  . Penicillins     REACTION: lips swell  . Penicillins Swelling    FAMILY HISTORY: Family History  Problem Relation Age of Onset  . COPD Mother   . Irritable bowel syndrome Mother   . Other Mother        esophageal polyps  . Skin cancer Father   . Irritable bowel syndrome Father   . Other Maternal Grandfather        small cell  . Breast cancer Paternal Grandmother   . Stomach cancer Paternal Grandmother   . Colon cancer Paternal Grandmother   . Breast cancer Paternal Aunt        great aunt  . Ovarian cancer Maternal Aunt   . Esophageal cancer Neg Hx   . Rectal cancer Neg Hx    SOCIAL HISTORY: Social History   Socioeconomic History  . Marital status: Legally Separated    Spouse name: Not on file  . Number of children: Not on file  . Years of education: Not on file  . Highest education level: Not on file  Occupational History  . Not on file  Tobacco Use  . Smoking status: Current Every Day Smoker    Packs/day: 0.50    Types: Cigarettes  . Smokeless tobacco: Never Used  Substance and Sexual Activity  . Alcohol use: Yes    Comment: 1-2 day beer a day  . Drug use: Yes    Types: Marijuana  . Sexual activity: Not on file  Other Topics Concern  . 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    Social  Determinants of Health   Financial Resource Strain:   . Difficulty of Paying Living Expenses:   Food Insecurity:   . Worried About Charity fundraiser in the Last Year:   . Arboriculturist in the Last Year:   Transportation Needs:   . Film/video editor (Medical):   Marland Kitchen Lack of Transportation (Non-Medical):   Physical Activity:   . Days of Exercise per Week:   . Minutes of Exercise per Session:   Stress:   . Feeling of Stress :   Social Connections:   . Frequency of Communication with Friends and Family:   . Frequency of Social Gatherings with Friends and Family:   . Attends Religious Services:   . Active Member of Clubs or Organizations:   . Attends Archivist Meetings:   Marland Kitchen Marital Status:   Intimate Partner Violence:   .  Fear of Current or Ex-Partner:   . Emotionally Abused:   Marland Kitchen Physically Abused:   . Sexually Abused:     PHYSICAL EXAM: Blood pressure 115/80, pulse 100, height 5\' 2"  (1.575 m), weight 100 lb 12.8 oz (45.7 kg), SpO2 100 %. General: No acute distress.  Patient appears well-groomed.  Head:  Normocephalic/atraumatic Eyes:  fundi examined but not visualized Neck: supple, no paraspinal tenderness, full range of motion Back: No paraspinal tenderness Heart: regular rate and rhythm Lungs: Clear to auscultation bilaterally. Vascular: No carotid bruits. Neurological Exam: Mental status: alert and oriented to person, place, and time, recent and remote memory intact, fund of knowledge intact, attention and concentration intact, speech fluent and not dysarthric, language intact. Cranial nerves: CN I: not tested CN II: pupils equal, round and reactive to light, visual fields intact CN III, IV, VI:  full range of motion, no nystagmus, no ptosis CN V: facial sensation intact CN VII: upper and lower face symmetric CN VIII: hearing intact CN IX, X: gag intact, uvula midline CN XI: sternocleidomastoid and trapezius muscles intact CN XII: tongue  midline Bulk & Tone: normal, no fasciculations. Motor:  5/5 throughout  Sensation:  Pinprick and vibration sensation intact. Deep Tendon Reflexes:  2+ throughout, toes downgoing.   Finger to nose testing:  Without dysmetria.   Heel to shin:  Without dysmetria.   Gait:  Normal station and stride.  Able to turn and tandem walk. Romberg negative.  IMPRESSION: New daily persistent headache.  Semiology not clear.  It may be migraines but no specific symptoms with migraines.  She endorses multiple chronic symptoms such as cognitive deficits, nausea and vomiting, dizziness.  Endorses significant emotional stress.  PLAN: 1.  As these are new daily headaches and as she endorses cognitive changes, will check MRI of brain with and without contrast. 2.  Increase topiramate to 75mg  at bedtime.  We can increase to 100mg  at bedtime in 6 weeks if needed. 3.  She will try sumatriptan 100mg  for rescue.  I informed her that it will not completely abort her headache but hopefully will reduce intensity fairly quickly if her persistent headache increases.   4.  Limit use of pain relievers to no more than 2 days out of week to prevent risk of rebound or medication-overuse headache. 5.  Caffeine cessation 6.  Follow up in 4 months.   Thank you for allowing me to take part in the care of this patient.  Metta Clines, DO  CC: Flossie Buffy, NP

## 2020-03-16 ENCOUNTER — Other Ambulatory Visit: Payer: Self-pay

## 2020-03-16 ENCOUNTER — Telehealth: Payer: Self-pay | Admitting: Surgery

## 2020-03-16 ENCOUNTER — Ambulatory Visit (INDEPENDENT_AMBULATORY_CARE_PROVIDER_SITE_OTHER): Payer: BC Managed Care – PPO | Admitting: Neurology

## 2020-03-16 ENCOUNTER — Encounter: Payer: Self-pay | Admitting: Neurology

## 2020-03-16 ENCOUNTER — Telehealth: Payer: Self-pay | Admitting: Cardiology

## 2020-03-16 ENCOUNTER — Telehealth (HOSPITAL_COMMUNITY): Payer: Self-pay

## 2020-03-16 VITALS — BP 115/80 | HR 100 | Ht 62.0 in | Wt 100.8 lb

## 2020-03-16 DIAGNOSIS — G4452 New daily persistent headache (NDPH): Secondary | ICD-10-CM | POA: Diagnosis not present

## 2020-03-16 DIAGNOSIS — R4189 Other symptoms and signs involving cognitive functions and awareness: Secondary | ICD-10-CM | POA: Diagnosis not present

## 2020-03-16 DIAGNOSIS — R6889 Other general symptoms and signs: Secondary | ICD-10-CM

## 2020-03-16 MED ORDER — TOPIRAMATE 25 MG PO TABS: 75.0000 mg | ORAL_TABLET | Freq: Every day | ORAL | 3 refills | Status: DC

## 2020-03-16 MED ORDER — SUMATRIPTAN SUCCINATE 100 MG PO TABS: ORAL_TABLET | ORAL | 3 refills | Status: DC

## 2020-03-16 NOTE — Patient Instructions (Addendum)
MRI of brain with and without contrast. We have sent a referral to [facility] for your MRI and they will call you directly to schedule your appointment. They are located at [place]. If you need to contact them directly please call [phone]. 1.  2. Increase topiramate to 75mg  at bedtime.  When you finish my prescription and headaches not improved, contact me and I will increase dose 3.  For rescue therapy, take sumatriptan 100mg .  May repeat in 2 hours if needed (maximum 2 tablets in 24 hours) 4.  Limit use of pain relievers to no more than 2 days out of week to prevent risk of rebound or medication-overuse headache. 5.  Keep headache diary 6.  Exercise, hydration, caffeine cessation, sleep hygiene, monitor for and avoid triggers 7.  Consider:  magnesium citrate 400mg  daily, riboflavin 400mg  daily, and coenzyme Q10 100mg  three times daily 8. Always keep in mind that currently taking a hormone or birth control may be a possible trigger or aggravating factor for migraine. 9. Follow up 4 months.

## 2020-03-16 NOTE — Telephone Encounter (Signed)
New Message   Patient is calling because she was to have a nuclear stress test(exercise) but she did not want to have a covid test. Therefore she wants to have the [facility]. She is calling to see if the request has been sent to the insurance company. Please call to discuss.

## 2020-03-16 NOTE — Telephone Encounter (Signed)
Spoke with pt, she is wanting to schedule her stress test. She is concerned about the coverage and if she will have to pay. Testing scheduled and message sent to pre-cert to contact the patient with any issues.

## 2020-03-16 NOTE — Telephone Encounter (Signed)
Called patient and went over results of upper GI (negative for hiatal hernia or reflux), gastric emptying study (normal), esophageal manometry (normal) and ph probe (which was done on ppi, and showed weak reflux episodes not correlated with symptoms, demeester 9, results suggesting good acid suppression on medication).  She does feel the reflux med has helped her symptoms. The vomiting is much less.  She has several issues being worked up right now. I recommend continuing medical management of her reflux for the time being, and once she is in a good place with other issues can re-assess the role for an antireflux procedure. Given no hiatal hernia on upper gi or manometry, she may be a candidate for TIF which is what she originally sought- will defer further management to Dr. Bryan Lemma with whom she has an appointment next week. I will be happy to see her in follow up if she needs surgical care in the future.

## 2020-03-16 NOTE — Telephone Encounter (Signed)
Encounter complete. 

## 2020-03-20 NOTE — Addendum Note (Signed)
Addended by: Merri Ray A on: 03/20/2020 04:34 PM   Modules accepted: Orders

## 2020-03-22 ENCOUNTER — Other Ambulatory Visit: Payer: Self-pay

## 2020-03-22 ENCOUNTER — Ambulatory Visit (HOSPITAL_COMMUNITY)
Admission: RE | Admit: 2020-03-22 | Discharge: 2020-03-22 | Disposition: A | Discharge status: Home / Self Care | Payer: BC Managed Care – PPO | Source: Ambulatory Visit | Attending: Internal Medicine | Admitting: Internal Medicine

## 2020-03-22 DIAGNOSIS — R6889 Other general symptoms and signs: Secondary | ICD-10-CM | POA: Diagnosis not present

## 2020-03-22 LAB — MYOCARDIAL PERFUSION IMAGING
LV dias vol: 80 mL (ref 46–106)
LV sys vol: 37 mL
Peak HR: 67 {beats}/min
Rest HR: 70 {beats}/min
SDS: 4
SRS: 4
SSS: 8
TID: 1.02

## 2020-03-22 MED ORDER — TECHNETIUM TC 99M TETROFOSMIN IV KIT
31.0000 | PACK | Freq: Once | INTRAVENOUS | Status: AC | PRN
  Administered 2020-03-22: 31 via INTRAVENOUS
  Filled 2020-03-22: qty 31

## 2020-03-22 MED ORDER — TECHNETIUM TC 99M TETROFOSMIN IV KIT
10.9000 | PACK | Freq: Once | INTRAVENOUS | Status: AC | PRN
  Administered 2020-03-22: 10.9 via INTRAVENOUS
  Filled 2020-03-22: qty 11

## 2020-03-22 MED ORDER — AMINOPHYLLINE 25 MG/ML IV SOLN
75.0000 mg | Freq: Once | INTRAVENOUS | Status: AC
  Administered 2020-03-22: 75 mg via INTRAVENOUS

## 2020-03-22 MED ORDER — REGADENOSON 0.4 MG/5ML IV SOLN
0.4000 mg | Freq: Once | INTRAVENOUS | Status: AC
Start: 2020-03-22 — End: 2020-03-22
  Administered 2020-03-22: 0.4 mg via INTRAVENOUS

## 2020-03-23 ENCOUNTER — Encounter: Payer: Self-pay | Admitting: Gastroenterology

## 2020-03-23 ENCOUNTER — Ambulatory Visit (INDEPENDENT_AMBULATORY_CARE_PROVIDER_SITE_OTHER): Payer: BC Managed Care – PPO | Admitting: Gastroenterology

## 2020-03-23 VITALS — BP 100/52 | HR 95 | Temp 98.2°F | Ht 62.0 in | Wt 98.0 lb

## 2020-03-23 DIAGNOSIS — K2100 Gastro-esophageal reflux disease with esophagitis, without bleeding: Secondary | ICD-10-CM | POA: Diagnosis not present

## 2020-03-23 DIAGNOSIS — R197 Diarrhea, unspecified: Secondary | ICD-10-CM | POA: Diagnosis not present

## 2020-03-23 NOTE — Progress Notes (Signed)
P  Chief Complaint:    GERD, hiatal hernia, nausea/vomiting, diarrhea  GI History: 39 year old femalewith a history of anxiety, depression, bipolar disorder, ADHD, hypotension, tobacco use,follows in the GI clinic for reflux,diarrhea, and symptomatic hemorrhoids.  1) GERD/Hiatal hernia:Index reflux symptoms ofregurgitation,nausea, chronic cough, raspy voice, increased throat clearing. Nocturnal cough and choking sensation. No associated exacerbating foods.Occasional mucus-like sensation in esophagus, no dysphagia or odynophagia. Has been taking antiemetics for 10-12 years, and was told she had reflux approx 17 years ago. Was seen by ENT and told this was reflux related sxs. Started taking OTC omeprazolebut continuedbreakthrough symptoms. Started on Protonix 40 mg/dayin 1/2020withincomplete response,and increased to 40 mgbidin 11/2018. -EGD (11/2019, Dr. Bryan Lemma): LA GradeBesophagitis, Hill grade 3 valve, mild non-H. pylori gastritis, benign duodenal polyp -GERD-HRQLQuestionnaire score: 27/50, checked "dissatisfied "with current health condition due to reflux -Referred to Dr. Kae Heller at [place] for consideration of cTIF.  Decision to hold off hernia repair/antireflux surgery due to other active clinical issues and good control of reflux on studies.    -UGI series (12/2019): Normal.  No hernia appreciated.  Partial emesis during swallowing of thick barium -GES (12/2019): Normal -pH/impedance (02/2020; on PPI): DeMeester score 9.4 (normal), slightly elevated number of weekly acid reflux episodes, otherwise normal study (good control of acid on PPI).  No symptom correlation -Esophageal manometry (02/2020): Normal -CT head (12/2019): Normal -CT abdomen/pelvis (12/2019): Normal GI and hepatobiliary/pancreas.  2.5 cm cyst or follicle of right ovary  2)Diarrhea: Episodic watery, nonbloody stools for the last 2 years. Occasional scant BRB on tissue paper. Has had stool incontinence.  Some improvement with trial of Imodium prn. Was recently started on Lomotil with resolution. But she would like to find etiology rather than ongoing med use. Consumes a good amount of dietary fiber. Has not trialed other meds or fiber supplement, etc. No preceding exposures, travel, Abx, etc. -Negative GI PCR panel -Colonoscopy (11/2019, Dr. Bryan Lemma): 12 mm polyp (SSP), 4 polyps in rectum and sigmoid (SSP), internal hemorrhoids, normal TI. Otherwise, biopsies negative for MC. Repeat in 3 years  3) Postnasal drainage: Postnasal drip symptoms x1.5-2 years. Was evaluated by ENT at [facility] in 07/2019. Felt postnasal drainage was related to reflux advised Prilosec 20 mg bidand nasal hygiene instructions.  4) Symptomatic hemorrhoids: Intermittent hematochezia, rectal irritation, itching. Suboptimal response to conservative management.  Resolution with hemorrhoid banding. -12/17/2019: Successful banding of RA hemorrhoid -01/21/2020: Successful banding of the LL hemorrhoid  HPI:     Patient is a 39 y.o. female presenting to the Gastroenterology Clinic for follow-up.  Since her last appointment with me, has been evaluated in the Neurology clinic (headaches, cognitive changes; started Topamax), Cardiology clinic (dizziness; 14-day ZIO monitor, myocardial perfusion scan), and by Dr. Windle Guard at [place].  Given extensive work-up as above and otherwise good control of reflux symptoms on PPI therapy, elected to hold off on crural repair/antireflux surgery in favor of treatment of other medical and psychiatric conditions.  Today, she states she has multiple GI symptoms to include nausea/vomiting and diarrhea over the last week. Increased Topamax to 75 mg (from 50 mg) around that same time (ADR diarrhea 11%, n/v 13%).  Reflux otherwise well controlled on current therapy.  No return of hemorrhoidal symptoms.  Follows regularly with therapist.  States that she has elevated levels of stress and anxiety. Feels  like she has been having mixed messages from medical results.    Review of systems:     No chest pain, no SOB, no fevers, no  urinary sx   Past Medical History:  Diagnosis Date  . ADHD   . ANEMIA, IRON DEFICIENCY 09/25/2007   Qualifier: Diagnosis of  By: Wynetta Emery LPN, Kim    . Anxiety   . Bipolar I disorder ([place]) 05/14/2011  . Cystitis 12/22/2018  . Decreased libido 09/17/2019  . Depression   . 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 GERD (gastroesophageal reflux disease) 10/27/2019  . Hypotension 09/17/2019  . Migraine   . PTSD (post-traumatic stress disorder)     Patient's surgical history, family medical history, social history, medications and allergies were all reviewed in Epic    Current Outpatient Medications  Medication Sig Dispense Refill  . busPIRone (BUSPAR) 7.5 MG tablet Take 1 tablet (7.5 mg total) by mouth 3 (three) times daily. 90 tablet 1  . pantoprazole (PROTONIX) 40 MG tablet Take 1 tablet by mouth twice daily 112 tablet 0  . SUMAtriptan (IMITREX) 100 MG tablet Take 1 tablet for headache.  May repeat in 2 hours if headache persists or recurs.  Maximum 2 tablets in 24 hours (Patient taking differently: Take 1 tablet for headache.  May repeat in 2 hours if headache persists or recurs.  Maximum 2 tablets in 24 hours HASNT STARTED YET) 10 tablet 3  . topiramate (TOPAMAX) 25 MG tablet Take 3 tablets (75 mg total) by mouth at bedtime. 90 tablet 3   No current facility-administered medications for this visit.    Physical Exam:     BP (!) 100/52   Pulse 95   Temp 98.2 F (36.8 C)   Ht 5\' 2"  (1.575 m)   Wt 98 lb (44.5 kg)   BMI 17.92 kg/m   GENERAL:  Pleasant female in NAD PSYCH: : Cooperative, normal affect Musculoskeletal:  Normal muscle tone, normal strength NEURO: Alert and oriented x 3, no focal neurologic deficits   IMPRESSION and PLAN:    1) GERD with erosive esophagitis 2) LES laxity/Diaphragmatic defect While she does have a longstanding history of  reflux complicated by erosive esophagitis, her reflux symptoms are otherwise well controlled with current therapy.  She does have multiple other issues going on at this time, not the least of which is her increased stress/anxiety lately, which is exacerbating some of her GI symptoms.  Feel that it is more pertinent to treat underlying medical and psychiatric conditions prior to considering any surgical interventions.  I do understand that she is hopeful for antireflux surgery as a means to stop her significant reduce need for acid suppression therapy, but we both agree that postponing in favor of treating other issues is more paramount at this juncture.  While she does not have a significant hernia on either esophageal manometry or EGD, she does have significant LES laxity, which would require a laparoscopic crural defect repair followed by fundoplication (if we do indeed decide to move forward with surgical intervention).  -Continue Protonix as prescribed -Continue antireflux lifestyle/dietary modifications  3) Diarrhea -Previously responded to high-fiber diet/fiber supplement with Lomotil as needed.  Has since moved away from those.  Diarrhea worsened over the last week or so.  Likely related to both underlying anxiety along with potentially medication effect (Topamax with ADR of diarrhea and up to 11%) -Restart fiber supplement (Benefiber or Citrucel) and high-fiber diet.  Provided with fiber handout today -Okay to use Lomotil as needed -Evaluation to date otherwise unrevealing to include colonoscopy with biopsies, GI PCR panel  RTC in 3-6 months  I spent 30 minutes of time, including in depth chart  review, independent review of results as outlined above, communicating results with the patient directly, face-to-face time with the patient, coordinating care, and ordering studies and medications as appropriate, and documentation.           Lavena Bullion ,DO, FACG 03/23/2020, 11:54 AM

## 2020-03-23 NOTE — Patient Instructions (Signed)
Fiber Chart  You should be consuming 25-30g of fiber per day and drinking 8 glasses of water to help your bowels move regularly.  In the chart below you can look up how much fiber you are getting in an average day.  If you are not getting enough fiber, you should add a fiber supplement to your diet.  Examples of this include Metamucil, FiberCon and Citrucel.  These can be purchased at your local grocery store or pharmacy.      [email or web address]-guerrero.[email or web address]  Please start taking a daily fiber supplement such as citrucel, benefiber, metamucil, or fiber choice.   It was a pleasure to see you today!  Vito Cirigliano, D.O.

## 2020-03-24 DIAGNOSIS — F319 Bipolar disorder, unspecified: Secondary | ICD-10-CM | POA: Diagnosis not present

## 2020-03-24 DIAGNOSIS — F4310 Post-traumatic stress disorder, unspecified: Secondary | ICD-10-CM | POA: Diagnosis not present

## 2020-03-24 DIAGNOSIS — F411 Generalized anxiety disorder: Secondary | ICD-10-CM | POA: Diagnosis not present

## 2020-03-27 ENCOUNTER — Encounter: Payer: Self-pay | Admitting: Physician Assistant

## 2020-03-27 ENCOUNTER — Other Ambulatory Visit: Payer: Self-pay

## 2020-03-27 ENCOUNTER — Ambulatory Visit (INDEPENDENT_AMBULATORY_CARE_PROVIDER_SITE_OTHER): Payer: BC Managed Care – PPO | Admitting: Physician Assistant

## 2020-03-27 DIAGNOSIS — Z1283 Encounter for screening for malignant neoplasm of skin: Secondary | ICD-10-CM

## 2020-03-27 DIAGNOSIS — L700 Acne vulgaris: Secondary | ICD-10-CM | POA: Diagnosis not present

## 2020-03-27 DIAGNOSIS — L989 Disorder of the skin and subcutaneous tissue, unspecified: Secondary | ICD-10-CM | POA: Diagnosis not present

## 2020-03-27 DIAGNOSIS — L219 Seborrheic dermatitis, unspecified: Secondary | ICD-10-CM | POA: Diagnosis not present

## 2020-03-27 DIAGNOSIS — D485 Neoplasm of uncertain behavior of skin: Secondary | ICD-10-CM | POA: Diagnosis not present

## 2020-03-27 MED ORDER — CLINDAMYCIN PHOSPHATE 1 % EX GEL: Freq: Every day | CUTANEOUS | 0 refills | Status: DC

## 2020-03-27 NOTE — Progress Notes (Signed)
   New Patient Visit  Subjective  Vicki Erickson is a 39 y.o. female who presents for the following: Annual Exam (skin check-right post scalpx months- will not go away tx- neosporin). Spot on right posterior that doesn't go away. It has been there for 6 months. She has been cutting her own hair for that period of time. She tried Tgel and other shampoos. There seems to be a thick scab. It has bled and it does get sore. Seems to keep forming a new scab and seems like it may have a scar. She also seemed to have something going on around and inside her ears but that has gotten better with the tgel. The sores around the ears were sore but got better with the t gel. She has also been having trouble with sinus issues.    Objective  Well appearing patient in no apparent distress; mood and affect are within normal limits.  A full examination was performed including head, eyes, ears, nose, lips, neck, chest, axillae, abdomen, back, buttocks, bilateral upper extremities, bilateral lower extremities, hands, feet, fingers, toes, fingernails, and toenails. All findings within normal limits unless otherwise noted below. No suspicious moles noted on back.  Objective  Right Occipital Scalp: Inflamed scabbed papule scalp       Objective  Left Zygomatic Area: Erythematous papules sides of face. History of accutane as a young adult.   Objective  Right Concha: Ears appear clear today but she gives a history of a scaling or breaking out in the ear.   Assessment & Plan  Neoplasm of uncertain behavior of skin Right Occipital Scalp  Skin / nail biopsy Type of biopsy: tangential   Informed consent: discussed and consent obtained   Timeout: patient name, date of birth, surgical site, and procedure verified   Procedure prep:  Patient was prepped and draped in usual sterile fashion (Non sterile) Prep type:  Chlorhexidine Anesthesia: the lesion was anesthetized in a standard fashion   Anesthetic:  1%  lidocaine w/ epinephrine 1-100,000 local infiltration Instrument used: flexible razor blade   Outcome: patient tolerated procedure well   Post-procedure details: wound care instructions given    Specimen 1 - Surgical pathology Differential Diagnosis: PN Check Margins: No  Acne vulgaris Left Zygomatic Area  clindamycin (CLINDAGEL) 1 % gel - Left Zygomatic Area  Seborrheic dermatitis Right Concha  1% hydrocortisone cream prn  Skin exam for malignant neoplasm Right Abdomen (side) - Upper  Total body skin exam

## 2020-03-27 NOTE — Patient Instructions (Addendum)
  Over the counter- CLN solution   Biopsy, Surgery (Curettage) & Surgery (Excision) Aftercare Instructions  1. Okay to remove bandage in 24 hours  2. Wash area with soap and water  3. Apply Vaseline to area twice daily until healed (Not Neosporin)  4. Okay to cover with a Band-Aid to decrease the chance of infection or prevent irritation from clothing; also it's okay to uncover lesion at home.  5. Suture instructions: return to our office in 7-10 or 10-14 days for a nurse visit for suture removal. Variable healing with sutures, if pain or itching occurs call our office. It's okay to shower or bathe 24 hours after sutures are given.  6. The following risks may occur after a biopsy, curettage or excision: bleeding, scarring, discoloration, recurrence, infection (redness, yellow drainage, pain or swelling).  7. For questions, concerns and results call our office at [place] before 4pm & Friday before 3pm. Biopsy results will be available in 1 week.

## 2020-03-29 DIAGNOSIS — R42 Dizziness and giddiness: Secondary | ICD-10-CM | POA: Diagnosis not present

## 2020-03-30 NOTE — Progress Notes (Signed)
Cardiology Office Note   Date:  03/31/2020   ID:  Vicki Erickson, DOB 1980/10/30, MRN [id number]  PCP:  Flossie Buffy, NP  Cardiologist:   Minus Breeding, MD  Chief Complaint  Patient presents with  . Palpitations      History of Present Illness: Vicki Erickson is a 39 y.o. female who presents for follow-up of palpitations and chest pain  Perfusion study was negative.    She had a monitor that demonstrated brief runs of SVT.   This did not necessarily correlate with her symptoms of skipping beats, irregular beats, shortness of breath, fluttering/racing, pounding, lightheadedness, dizziness, pain and tingling. All of these complaints have been with normal sinus rhythm. She actually did not report any complaints of the brief runs of SVT that she had. She also had a negative perfusion study with no evidence of ischemia.   Past Medical History:  Diagnosis Date  . ADHD   . ANEMIA, IRON DEFICIENCY 09/25/2007   Qualifier: Diagnosis of  By: Wynetta Emery LPN, Kim    . Anxiety   . Bipolar I disorder ([place]) 05/14/2011  . Cystitis 12/22/2018  . Decreased libido 09/17/2019  . Depression   . 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 GERD (gastroesophageal reflux disease) 10/27/2019  . Hypotension 09/17/2019  . Migraine   . PTSD (post-traumatic stress disorder)     Past Surgical History:  Procedure Laterality Date  . COLONOSCOPY  11/2019  . ESOPHAGEAL MANOMETRY N/A 02/23/2020   Procedure: ESOPHAGEAL MANOMETRY (EM);  Surgeon: Mauri Pole, MD;  Location: [facility];  Service: Endoscopy;  Laterality: N/A;  . [place] IMPEDANCE STUDY N/A 02/23/2020   Procedure: [place] IMPEDANCE STUDY;  Surgeon: Mauri Pole, MD;  Location: [facility];  Service: Endoscopy;  Laterality: N/A;  . UPPER GI ENDOSCOPY  11/2019     Current Outpatient Medications  Medication Sig Dispense Refill  . busPIRone (BUSPAR) 7.5 MG tablet Take 1 tablet (7.5 mg total) by mouth 3 (three) times daily. 90 tablet 1  .  clindamycin (CLINDAGEL) 1 % gel Apply topically daily. 30 g 0  . FLUoxetine (PROZAC) 10 MG capsule Take 10 mg by mouth daily.    . pantoprazole (PROTONIX) 40 MG tablet Take 1 tablet by mouth twice daily 112 tablet 0  . SUMAtriptan (IMITREX) 100 MG tablet Take 1 tablet for headache.  May repeat in 2 hours if headache persists or recurs.  Maximum 2 tablets in 24 hours 10 tablet 3  . topiramate (TOPAMAX) 25 MG tablet Take 3 tablets (75 mg total) by mouth at bedtime. 90 tablet 3   No current facility-administered medications for this visit.    Allergies:   Other, Penicillins, and Penicillins    ROS:  Please see the history of present illness.   Otherwise, review of systems are positive for none.   All other systems are reviewed and negative.    PHYSICAL EXAM: VS:  BP 102/60   Pulse 96   Ht 5\' 2"  (1.575 m)   Wt 98 lb 3.2 oz (44.5 kg)   SpO2 98%   BMI 17.96 kg/m  , BMI Body mass index is 17.96 kg/m. GENERAL:  Well appearing NECK:  No jugular venous distention, waveform within normal limits, carotid upstroke brisk and symmetric, no bruits, no thyromegaly LUNGS:  Clear to auscultation bilaterally CHEST:  Unremarkable HEART:  PMI not displaced or sustained,S1 and S2 within normal limits, no S3, no S4, no clicks, no rubs, no murmurs ABD:  Flat, positive  bowel sounds normal in frequency in pitch, no bruits, no rebound, no guarding, no midline pulsatile mass, no hepatomegaly, no splenomegaly EXT:  2 plus pulses throughout, no edema, no cyanosis no clubbing  EKG:  EKG is not ordered today.    Recent Labs: 01/27/2020: ALT 8; Hemoglobin 13.7; Platelets 457.0 02/18/2020: BUN 8; Creatinine, Ser 0.92; Potassium 4.9; Sodium 140; TSH 1.670    Lipid Panel    Component Value Date/Time   CHOL 129 01/27/2020 0942   TRIG 114.0 01/27/2020 0942   HDL 37.00 (L) 01/27/2020 0942   CHOLHDL 3 01/27/2020 0942   VLDL 22.8 01/27/2020 0942   LDLCALC 70 01/27/2020 0942      Wt Readings from Last 3  Encounters:  03/31/20 98 lb 3.2 oz (44.5 kg)  03/23/20 98 lb (44.5 kg)  03/22/20 100 lb (45.4 kg)      Other studies Reviewed: Additional studies/ records that were reviewed today include: Monitor. Review of the above records demonstrates:  Please see elsewhere in the note.     ASSESSMENT AND PLAN:  Dizziness    The etiology of this is not clear. I do not see a cardiac cause. Certainly not related to arrhythmias. She is seeing a neurologist. No further cardiac work-up. Her brief runs of SVT did not correlate with any symptoms. I would not suggest empiric treatment with a beta-blocker or calcium channel blocker as I think this might exacerbate other symptoms such as exercise intolerance.  Exercise intolerance She had a normal stress test. No significant arrhythmias. No change in therapy. I will defer to her primary provider.   Current medicines are reviewed at length with the patient today.  The patient does not have concerns regarding medicines.  The following changes have been made:  no change  Labs/ tests ordered today include: None No orders of the defined types were placed in this encounter.    Disposition:   FU with [place] as needed.      Signed, Minus Breeding, MD  03/31/2020 10:39 AM    [facility]

## 2020-03-31 ENCOUNTER — Ambulatory Visit (INDEPENDENT_AMBULATORY_CARE_PROVIDER_SITE_OTHER): Payer: BC Managed Care – PPO | Admitting: Cardiology

## 2020-03-31 ENCOUNTER — Encounter: Payer: Self-pay | Admitting: Cardiology

## 2020-03-31 ENCOUNTER — Other Ambulatory Visit: Payer: Self-pay

## 2020-03-31 VITALS — BP 102/60 | HR 96 | Ht 62.0 in | Wt 98.2 lb

## 2020-03-31 DIAGNOSIS — R002 Palpitations: Secondary | ICD-10-CM

## 2020-03-31 NOTE — Patient Instructions (Signed)
Medication Instructions:  NO CHANGES *If you need a refill on your cardiac medications before your next appointment, please call your pharmacy*  Lab Work: NONE ORDERED THIS VISIT  Testing/Procedures: NONE ORDERED THIS VISIT  Follow-Up: At [facility], you and your health needs are our priority.  As part of our continuing mission to provide you with exceptional heart care, we have created designated Provider Care Teams.  These Care Teams include your primary Cardiologist (physician) and Advanced Practice Providers (APPs -  Physician Assistants and Nurse Practitioners) who all work together to provide you with the care you need, when you need it.  We recommend signing up for the patient portal called "MyChart".  Sign up information is provided on this After Visit Summary.  MyChart is used to connect with patients for Virtual Visits (Telemedicine).  Patients are able to view lab/test results, encounter notes, upcoming appointments, etc.  Non-urgent messages can be sent to your provider as well.   To learn more about what you can do with MyChart, go to [email or web address].    Your next appointment:   NO FOLLOW UP NEEDED

## 2020-04-11 ENCOUNTER — Other Ambulatory Visit: Payer: Self-pay

## 2020-04-11 MED ORDER — PANTOPRAZOLE SODIUM 40 MG PO TBEC: 40.0000 mg | DELAYED_RELEASE_TABLET | Freq: Two times a day (BID) | ORAL | 2 refills | Status: AC

## 2020-04-13 ENCOUNTER — Encounter: Payer: Self-pay | Admitting: Physician Assistant

## 2020-04-15 ENCOUNTER — Other Ambulatory Visit: Payer: Self-pay

## 2020-04-15 ENCOUNTER — Ambulatory Visit
Admission: RE | Admit: 2020-04-15 | Discharge: 2020-04-15 | Disposition: A | Discharge status: Home / Self Care | Payer: BC Managed Care – PPO | Source: Ambulatory Visit | Attending: Neurology | Admitting: Neurology

## 2020-04-15 DIAGNOSIS — R4189 Other symptoms and signs involving cognitive functions and awareness: Secondary | ICD-10-CM

## 2020-04-15 DIAGNOSIS — R519 Headache, unspecified: Secondary | ICD-10-CM | POA: Diagnosis not present

## 2020-04-15 DIAGNOSIS — G4452 New daily persistent headache (NDPH): Secondary | ICD-10-CM

## 2020-04-15 MED ORDER — GADOBENATE DIMEGLUMINE 529 MG/ML IV SOLN
9.0000 mL | Freq: Once | INTRAVENOUS | Status: AC | PRN
  Administered 2020-04-15: 9 mL via INTRAVENOUS

## 2020-04-18 ENCOUNTER — Telehealth: Payer: Self-pay

## 2020-04-18 ENCOUNTER — Telehealth: Payer: Self-pay | Admitting: Neurology

## 2020-04-18 DIAGNOSIS — F319 Bipolar disorder, unspecified: Secondary | ICD-10-CM | POA: Diagnosis not present

## 2020-04-18 DIAGNOSIS — F902 Attention-deficit hyperactivity disorder, combined type: Secondary | ICD-10-CM | POA: Diagnosis not present

## 2020-04-18 DIAGNOSIS — F4310 Post-traumatic stress disorder, unspecified: Secondary | ICD-10-CM | POA: Diagnosis not present

## 2020-04-18 DIAGNOSIS — F411 Generalized anxiety disorder: Secondary | ICD-10-CM | POA: Diagnosis not present

## 2020-04-18 NOTE — Telephone Encounter (Signed)
AccessNurse 04/18/20 @ 12:39PM:  "Caller states she missed a call from the office."

## 2020-04-18 NOTE — Telephone Encounter (Signed)
-----   Message from Pieter Partridge, DO sent at 04/18/2020  7:12 AM EDT ----- MRI of brain is normal

## 2020-04-18 NOTE — Telephone Encounter (Signed)
Tried calling pt back no answer. LMOVM.  

## 2020-04-18 NOTE — Telephone Encounter (Signed)
LMOVM to call office back in regards to her [place] results

## 2020-04-18 NOTE — Telephone Encounter (Signed)
3rd VM left in regards to pt MRI result. Results left on VM

## 2020-04-18 NOTE — Telephone Encounter (Signed)
Left message with the after hour service on 04-18-20 at 12:00    Caller states that she received a call from our office she had a MRi on Saturday please call

## 2020-04-21 DIAGNOSIS — F411 Generalized anxiety disorder: Secondary | ICD-10-CM | POA: Diagnosis not present

## 2020-04-21 DIAGNOSIS — F319 Bipolar disorder, unspecified: Secondary | ICD-10-CM | POA: Diagnosis not present

## 2020-04-21 DIAGNOSIS — F4310 Post-traumatic stress disorder, unspecified: Secondary | ICD-10-CM | POA: Diagnosis not present

## 2020-07-17 ENCOUNTER — Other Ambulatory Visit: Payer: Self-pay | Admitting: Physician Assistant

## 2020-07-17 DIAGNOSIS — L700 Acne vulgaris: Secondary | ICD-10-CM

## 2020-08-01 NOTE — Progress Notes (Deleted)
NEUROLOGY FOLLOW UP OFFICE NOTE  Vicki Erickson [id number]  HISTORY OF PRESENT ILLNESS: Vicki Erickson is a 39 year old right-handed female with Bipolar I disorder, PTSD, depression who follows up for headache.  UPDATE: MRI of brain with and without contrast on 04/17/2020 personally reviewed Increased topiramate in June. Intensity:  *** Duration:  *** Frequency:  *** Frequency of abortive medication: *** Current NSAIDS:  none Current analgesics:  Midol Current triptans:  sumatriptan 100mg  Current ergotamine:  none Current anti-emetic:  none Current muscle relaxants:  none Current anti-anxiolytic:  Buspirone 7.5mg  TID Current sleep aide:  none Current Antihypertensive medications:  none Current Antidepressant medications:  none Current Anticonvulsant medications:  topiramate 75mg  at bedtime Current anti-CGRP:  none Current Vitamins/Herbal/Supplements:  none Current Antihistamines/Decongestants:  none Other therapy:  Massage therapy Hormone/birth control:  none  Caffeine:  *** Alcohol:  *** Smoker:  *** Diet:  *** Exercise:  *** Depression:  ***; Anxiety:  *** Other pain:  *** Sleep hygiene:  ***  HISTORY: She has had dizzy/presyncopal spells since childhood.  She recorded her heart rate on her Apple watch fluctuating from 41 to 198 bpm.  For 10 years, she has had chronic nausea and vomiting.  She was diagnosed with severe GERD and hiatal hernia.  Since February, she reports persistent daily headache.  No previous headache history.  It is a 2/22 holocephalic (various location) throbbing and pressure like pain. She has neck pain. Some photophobia but really no associated symptoms specific to the headache.  She cannot take NSAIDs due to her GERD.  She takes Midol and rizatriptan, which both provide modest relief.  She has been on topiramate since February with no improvement.  She reports that she has been bedridden.  She also reports trouble with concentrating and  trouble reading and writing.  She has not been able to work.  She reports significant emotional stress.  She was evaluated in the [facility] ED on 01/01/2020.  CT head was negative for mass lesion or other acute intracranial abnormality.  She was treated with IM Toradol.     Past NSAIDS:  none Past analgesics:  Excedrin Migraine; Tylenol Past abortive triptans:  Maxalt 10mg  Past abortive ergotamine:  none Past muscle relaxants:  none Past anti-emetic:  Promethazine 25mg  Past antihypertensive medications:  none Past antidepressant medications:  fluoxetine Past anticonvulsant medications:  lamotrigine Past anti-CGRP:  none Past vitamins/Herbal/Supplements:  none Past antihistamines/decongestants:  none Other past therapies:  none   PAST MEDICAL HISTORY: Past Medical History:  Diagnosis Date  . ADHD   . ANEMIA, IRON DEFICIENCY 09/25/2007   Qualifier: Diagnosis of  By: Wynetta Emery LPN, Kim    . Anxiety   . Bipolar I disorder ([place]) 05/14/2011  . Cystitis 12/22/2018  . Decreased libido 09/17/2019  . Depression   . 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 GERD (gastroesophageal reflux disease) 10/27/2019  . Hypotension 09/17/2019  . Migraine   . PTSD (post-traumatic stress disorder)     MEDICATIONS: Current Outpatient Medications on File Prior to Visit  Medication Sig Dispense Refill  . clindamycin (CLINDAGEL) 1 % gel APPLY  THIN LAYER TOPICALLY ONCE DAILY 30 g 0  . busPIRone (BUSPAR) 7.5 MG tablet Take 1 tablet (7.5 mg total) by mouth 3 (three) times daily. 90 tablet 1  . FLUoxetine (PROZAC) 10 MG capsule Take 10 mg by mouth daily.    . pantoprazole (PROTONIX) 40 MG tablet Take 1 tablet (40 mg total) by mouth 2 (two) times daily. [place]  tablet 2  . SUMAtriptan (IMITREX) 100 MG tablet Take 1 tablet for headache.  May repeat in 2 hours if headache persists or recurs.  Maximum 2 tablets in 24 hours 10 tablet 3  . topiramate (TOPAMAX) 25 MG tablet Take 3 tablets (75 mg total) by mouth at bedtime.  90 tablet 3   No current facility-administered medications on file prior to visit.    ALLERGIES: Allergies  Allergen Reactions  . Other Hives    Mint (leave)  . Penicillins     REACTION: lips swell  . Penicillins Swelling    FAMILY HISTORY: Family History  Problem Relation Age of Onset  . COPD Mother   . Irritable bowel syndrome Mother   . Other Mother        esophageal polyps  . Skin cancer Father   . Irritable bowel syndrome Father   . Other Maternal Grandfather        small cell  . Breast cancer Paternal Grandmother   . Stomach cancer Paternal Grandmother   . Colon cancer Paternal Grandmother   . Breast cancer Paternal Aunt        great aunt  . Ovarian cancer Maternal Aunt   . Esophageal cancer Neg Hx   . Rectal cancer Neg Hx    ***.  SOCIAL HISTORY: Social History   Socioeconomic History  . Marital status: Legally Separated    Spouse name: Not on file  . Number of children: 0  . Years of education: Not on file  . Highest education level: Not on file  Occupational History  . Occupation: unemployed  Tobacco Use  . Smoking status: Current Every Day Smoker    Packs/day: 0.50    Types: Cigarettes  . Smokeless tobacco: Never Used  Vaping Use  . Vaping Use: Some days  Substance and Sexual Activity  . Alcohol use: Yes    Comment: 1-2 day beer a day  . Drug use: Yes    Types: Marijuana  . Sexual activity: Not on file  Other Topics Concern  . Not on file  Social History Narrative   Right handed   Lives alone.  One Firefighter.    Social Determinants of Health   Financial Resource Strain:   . Difficulty of Paying Living Expenses: Not on file  Food Insecurity:   . Worried About Charity fundraiser in the Last Year: Not on file  . Ran Out of Food in the Last Year: Not on file  Transportation Needs:   . Lack of Transportation (Medical): Not on file  . Lack of Transportation (Non-Medical): Not on file  Physical Activity:   . Days of  Exercise per Week: Not on file  . Minutes of Exercise per Session: Not on file  Stress:   . Feeling of Stress : Not on file  Social Connections:   . Frequency of Communication with Friends and Family: Not on file  . Frequency of Social Gatherings with Friends and Family: Not on file  . Attends Religious Services: Not on file  . Active Member of Clubs or Organizations: Not on file  . Attends Archivist Meetings: Not on file  . Marital Status: Not on file  Intimate Partner Violence:   . Fear of Current or Ex-Partner: Not on file  . Emotionally Abused: Not on file  . Physically Abused: Not on file  . Sexually Abused: Not on file    REVIEW OF SYSTEMS: Constitutional: No fevers,  chills, or sweats, no generalized fatigue, change in appetite Eyes: No visual changes, double vision, eye pain Ear, nose and throat: No hearing loss, ear pain, nasal congestion, sore throat Cardiovascular: No chest pain, palpitations Respiratory:  No shortness of breath at rest or with exertion, wheezes GastrointestinaI: No nausea, vomiting, diarrhea, abdominal pain, fecal incontinence Genitourinary:  No dysuria, urinary retention or frequency Musculoskeletal:  No neck pain, back pain Integumentary: No rash, pruritus, skin lesions Neurological: as above Psychiatric: No depression, insomnia, anxiety Endocrine: No palpitations, fatigue, diaphoresis, mood swings, change in appetite, change in weight, increased thirst Hematologic/Lymphatic:  No purpura, petechiae. Allergic/Immunologic: no itchy/runny eyes, nasal congestion, recent allergic reactions, rashes  PHYSICAL EXAM: *** General: No acute distress.  Patient appears ***-groomed.   Head:  Normocephalic/atraumatic Eyes:  Fundi examined but not visualized Neck: supple, no paraspinal tenderness, full range of motion Heart:  Regular rate and rhythm Lungs:  Clear to auscultation bilaterally Back: No paraspinal tenderness Neurological Exam: alert and  oriented to person, place, and time. Attention span and concentration intact, recent and remote memory intact, fund of knowledge intact.  Speech fluent and not dysarthric, language intact.  CN II-XII intact. Bulk and tone normal, muscle strength 5/5 throughout.  Sensation to light touch, temperature and vibration intact.  Deep tendon reflexes 2+ throughout, toes downgoing.  Finger to nose and heel to shin testing intact.  Gait normal, Romberg negative.  IMPRESSION: ***  PLAN: ***  Vicki Clines, DO  CC: ***

## 2020-08-03 ENCOUNTER — Ambulatory Visit: Payer: BC Managed Care – PPO | Admitting: Neurology

## 2020-09-27 ENCOUNTER — Encounter: Payer: Self-pay | Admitting: Gastroenterology

## 2020-09-27 ENCOUNTER — Ambulatory Visit (INDEPENDENT_AMBULATORY_CARE_PROVIDER_SITE_OTHER): Payer: BLUE CROSS/BLUE SHIELD | Admitting: Gastroenterology

## 2020-09-27 VITALS — BP 102/64 | HR 68 | Ht 62.0 in | Wt 104.0 lb

## 2020-09-27 DIAGNOSIS — K2100 Gastro-esophageal reflux disease with esophagitis, without bleeding: Secondary | ICD-10-CM

## 2020-09-27 DIAGNOSIS — K449 Diaphragmatic hernia without obstruction or gangrene: Secondary | ICD-10-CM | POA: Diagnosis not present

## 2020-09-27 NOTE — Progress Notes (Signed)
P  Chief Complaint:    GERD with erosive esophagitis  GI History: 38 year old femalewith a history of anxiety, depression, bipolar disorder, ADHD, hypotension, tobacco use,follows in the GI clinic for reflux,diarrhea, and symptomatic hemorrhoids.  1) GERD/Hiatal hernia:Indexrefluxsymptoms ofregurgitation,nausea, chronic cough, raspy voice, increased throat clearing. Nocturnal cough and choking sensation. No associated exacerbating foods.Occasional mucus-like sensation in esophagus, no dysphagia or odynophagia. Has been taking antiemetics for 10-12 years, and was told she had reflux approx 17 years ago. Was seen by ENT and told this was reflux related sxs. Started taking OTC omeprazolebut continuedbreakthrough symptoms. Started on Protonix 40 mg/dayin 1/2020withincomplete response,and increased to 40 mgbidin 11/2018. -EGD (11/2019, Dr. Bryan Lemma): LA GradeBesophagitis, Hill grade 3 valve, mild non-H. pylori gastritis, benign duodenal polyp -GERD-HRQLQuestionnaire score: 27/50, checked "dissatisfied "with current health condition due to reflux -Referred to Dr. Kae Heller at [place] for consideration of cTIF.  Decision to hold off hernia repair/antireflux surgery due to other active clinical issues and good control of reflux on studies.    -UGI series (12/2019): Normal. No hernia appreciated. Partial emesis during swallowing of thick barium -GES (12/2019): Normal -pH/impedance (02/2020; on PPI): [place] score 9.4 (normal), slightly elevated number of weekly acid reflux episodes, otherwise normal study (good control of acid on PPI).  No symptom correlation -Esophageal manometry (02/2020): Normal -CT head (12/2019): Normal -CT abdomen/pelvis (12/2019): Normal GI and hepatobiliary/pancreas. 2.5 cm cyst or follicle of right ovary  2)Diarrhea: Episodic watery, nonbloody stools for the last 2 years. Occasional scant BRB on tissue paper. Has had stool incontinence. Some improvement  with trial of Imodium prn. Resolves when using Lomotil. Consumes a good amount of dietary fiber. Has not trialed other meds or fiber supplement, etc. No preceding exposures, travel, Abx, etc. -Negative GI PCR panel -Colonoscopy (11/2019, Dr. Bryan Lemma): 12 mm polyp (SSP), 4 polyps in rectum and sigmoid (SSP), internal hemorrhoids, normal TI. Otherwise, biopsies negative for MC. Repeat in 3 years  3)Postnasal drainage: Postnasal drip symptoms x1.5-2 years. Was evaluated by ENT at [facility] in 07/2019. Felt postnasal drainage was related to reflux advised Prilosec 20 mg bidand nasal hygiene instructions.  4) Symptomatic hemorrhoids: Intermittent hematochezia, rectal irritation, itching. Suboptimal response to conservative management.  Resolution with hemorrhoid banding. -12/17/2019:Successful banding of RA hemorrhoid -4/9.: Successful banding of the LL hemorrhoid  HPI:     Patient is a 39 y.o. female presenting to the Gastroenterology Clinic for follow-up.  Last seen by me on 03/23/2020.  At that time,c/o nausea/vomiting and diarrhea x1 week, possibly related to dose adjustment of Topamax.  Reflux symptoms were well controlled, and hemorrhoidal symptoms had resolved with prior banding.  Has been evaluated by Neurology (headaches, cognitive changes; normal MRI brain; RTC 4 months) and Cardiology (palpitations, dizziness; negative perfusion study, Zio monitor; RTC prn). Those sxs seem to have resolved and now off Topamax.   Today, states her reflux has been worsening lately.  Still taking Protonix 40 mg bid as prescribed, but with regular breakthrough HB, regurgitation, increased swallowing. +nocturnal sxs (choking, coughing) despite HOB elevation. States she was seen by dentist and told she has significant enamel erosion and gum irritation d/t reflux.   Moved last week to [place], [place] and will establish new PCM this week to assist in anxiety management.    Review of systems:     No chest  pain, no SOB, no fevers, no urinary sx   Past Medical History:  Diagnosis Date  . ADHD   . ANEMIA, IRON DEFICIENCY 09/25/2007  Qualifier: Diagnosis of  By: Wynetta Emery LPN, Kim    . Anxiety   . Bipolar I disorder ([place]) 05/14/2011  . Cystitis 12/22/2018  . Decreased libido 09/17/2019  . Depression   . 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 GERD (gastroesophageal reflux disease) 10/27/2019  . Hypotension 09/17/2019  . Migraine   . PTSD (post-traumatic stress disorder)     Patient's surgical history, family medical history, social history, medications and allergies were all reviewed in Epic    Current Outpatient Medications  Medication Sig Dispense Refill  . pantoprazole (PROTONIX) 40 MG tablet Take 1 tablet (40 mg total) by mouth 2 (two) times daily. 180 tablet 2   No current facility-administered medications for this visit.    Physical Exam:     Ht 5\' 2"  (1.575 m)   Wt 104 lb (47.2 kg)   BMI 19.02 kg/m   GENERAL:  Pleasant female in NAD PSYCH: : Cooperative, normal affect Musculoskeletal:  Normal muscle tone, normal strength NEURO: Alert and oriented x 3, no focal neurologic deficits   IMPRESSION and PLAN:    1) GERD with erosive esophagitis 2) LES laxity/Diaphragmatic defect 39 year old female with longstanding history of GERD with erosive esophagitis.  Reflux symptoms have worsened despite ongoing high-dose PPI therapy and continued antireflux lifestyle/dietary modifications.  Again discussed treatment options, to include ongoing medical management vs antireflux surgical options, and she strongly prefers the latter.  Plan for below:  -Repeat EGD now for preoperative assessment and to reassess for progression of hiatal hernia and LES laxity given breakthrough symptoms despite ongoing appropriate therapy -If only minimal hiatal hernia and depending on Hill grade valve, discussed TIF vs cTIF -If requiring laparoscopic hiatal hernia repair/crural repair, patient would like to be  seen by Dr. Redmond Pulling at [place] -In the meantime, continue Protonix as prescribed -Continue antireflux lifestyle/dietary modifications  3) Diarrhea -Symptoms resolved since de-escalating other medications -Previous work-up to include stool studies, colonoscopy biopsy, etc. otherwise unrevealing     4) Anxiety -Establishing with new PCM since she has moved to [facility] to assist in management  The indications, risks, and benefits of EGD were explained to the patient in detail. Risks include but are not limited to bleeding, perforation, adverse reaction to medications, and cardiopulmonary compromise. Sequelae include but are not limited to the possibility of surgery, hositalization, and mortality. The patient verbalized understanding and wished to proceed. All questions answered, referred to scheduler. Further recommendations pending results of the exam.        Lavena Bullion ,DO, FACG 09/27/2020, 9:53 AM

## 2020-09-27 NOTE — Patient Instructions (Signed)
If you are age 37 or older, your body mass index should be between 23-30. Your Body mass index is 19.02 kg/m. If this is out of the aforementioned range listed, please consider follow up with your Primary Care Provider.  If you are age 32 or younger, your body mass index should be between 19-25. Your Body mass index is 19.02 kg/m. If this is out of the aformentioned range listed, please consider follow up with your Primary Care Provider.   You have been scheduled for an endoscopy . Please follow the written instructions given to you at your visit today. Please pick up your prep supplies at the pharmacy within the next 1-3 days. If you use inhalers (even only as needed), please bring them with you on the day of your procedure.  Due to recent changes in healthcare laws, you may see the results of your imaging and laboratory studies on MyChart before your provider has had a chance to review them.  We understand that in some cases there may be results that are confusing or concerning to you. Not all laboratory results come back in the same time frame and the provider may be waiting for multiple results in order to interpret others.  Please give [place] 48 hours in order for your provider to thoroughly review all the results before contacting the office for clarification of your results.   Thank you for choosing me and [facility].  Vito Cirigliano, D.O.

## 2020-09-28 ENCOUNTER — Telehealth: Payer: Self-pay | Admitting: Gastroenterology

## 2020-09-28 NOTE — Telephone Encounter (Signed)
Inbound call from patient wanting to confirm appointment for covid test.  Date on letter is incorrect.  Please call patient to inform.

## 2020-09-28 NOTE — Telephone Encounter (Signed)
Called the patient and explained that there was a typo for her date of covid test. It is 11/01/2020. Patient verbalized understanding

## 2020-11-03 ENCOUNTER — Encounter: Payer: Self-pay | Admitting: Gastroenterology

## 2020-11-05 IMAGING — MR MR HEAD WO/W CM
11 series · 48 of 48 positions shown · IV contrast (multihance)
Comparison: Head CT 01/02/2020.

CLINICAL DATA: 39-year-old female with new daily headache since
[DATE] this year. Cognitive changes.

EXAM:
MRI HEAD WITHOUT AND WITH CONTRAST
TECHNIQUE: Multiplanar, multiecho pulse sequences of the brain and surrounding
structures were obtained without and with intravenous contrast.
CONTRAST:  9mL MULTIHANCE GADOBENATE DIMEGLUMINE 529 MG/ML IV SOLN

[Series 2: T1 · sagittal · 5.0mm · 0.45mm/px · 1 of 21 slices shown]
[im 1/21]
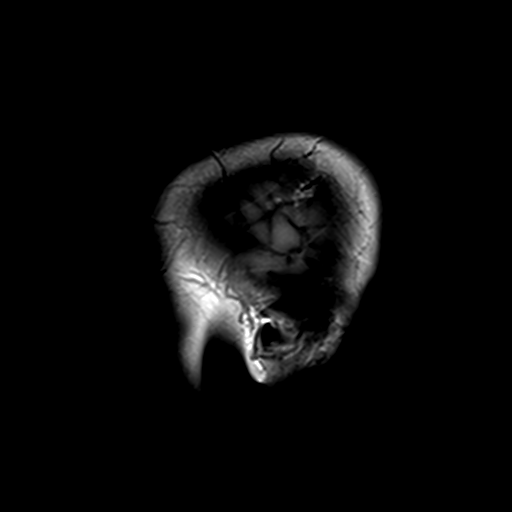

[Series 3: DWI · axial · 3.0mm · 1.80mm/px · z∈[-77,+70]mm · 8 of 100 slices shown (1 of 2)]
[im 1/100]
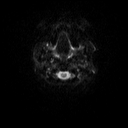
[im 15/100]
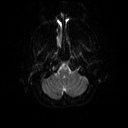
[im 29/100]
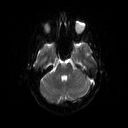
[im 43/100]
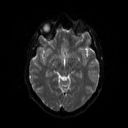
[im 57/100]
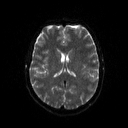
[im 71/100]
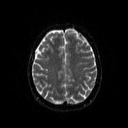
[im 85/100]
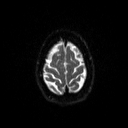
[im 100/100]
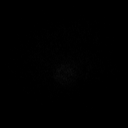

[Series 4: DWI · axial · 3.0mm · 1.80mm/px · z∈[-77,+70]mm · 4 of 49 slices shown (2 of 2)]
[im 1/49]
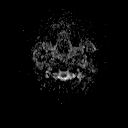
[im 17/49]
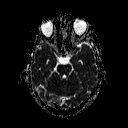
[im 33/49]
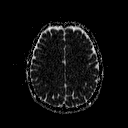
[im 49/49]
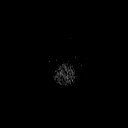

[Series 5: T2 · axial · 5.0mm · 0.60mm/px · z∈[-77,+65]mm · 2 of 22 slices shown (1 of 2)]
[im 1/22]
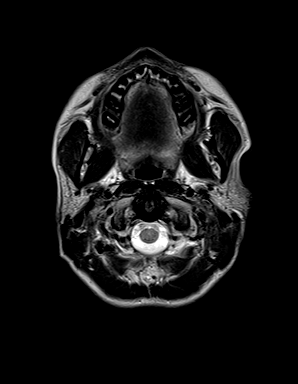
[im 22/22]
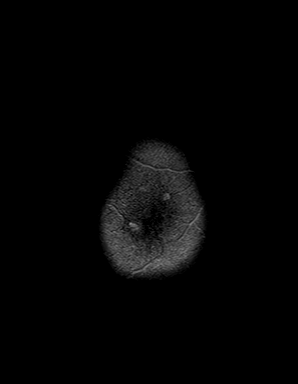

[Series 6: FLAIR · axial · 3.0mm · 0.45mm/px · z∈[-73,+62]mm · 2 of 30 slices shown (1 of 2)]
[im 1/30]
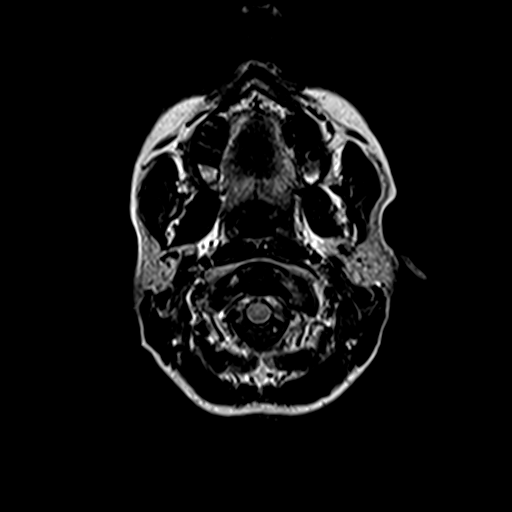
[im 30/30]
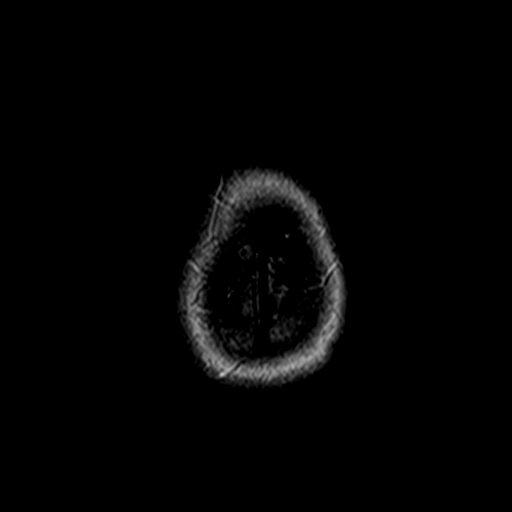

[Series 8: swi_images · axial · 4.0mm · 0.90mm/px · z∈[-76,+64]mm · 3 of 36 slices shown]
[im 1/36]
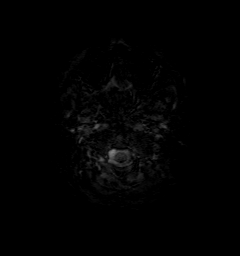
[im 18/36]
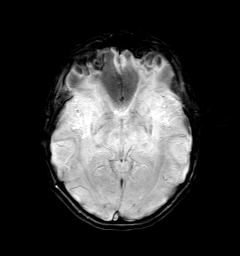
[im 36/36]
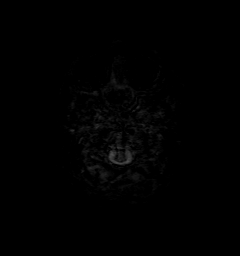

[Series 9: t1_mpr_tra · axial · 1.0mm · 0.75mm/px · z∈[-78,+65]mm · 11 of 144 slices shown (1 of 2)]
[im 1/144]
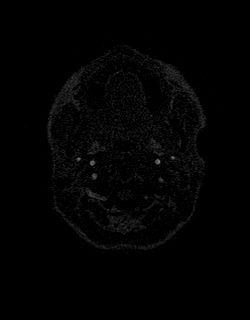
[im 15/144]
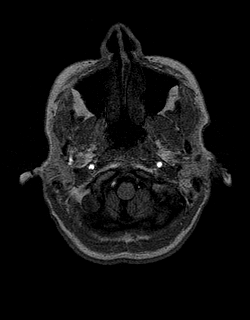
[im 29/144]
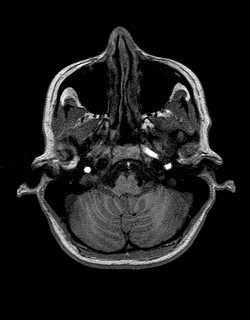
[im 43/144]
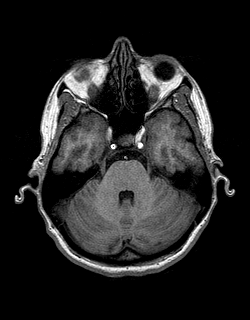
[im 58/144]
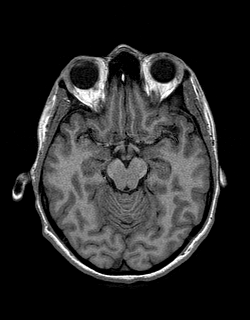
[im 72/144]
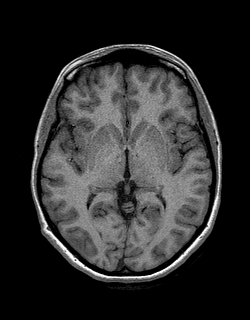
[im 86/144]
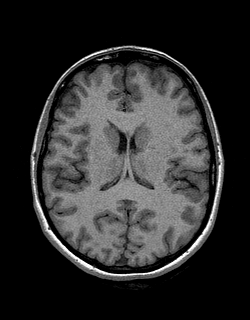
[im 101/144]
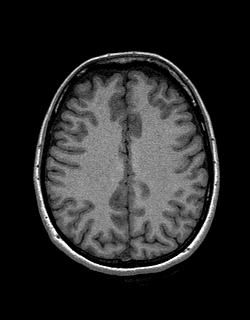
[im 115/144]
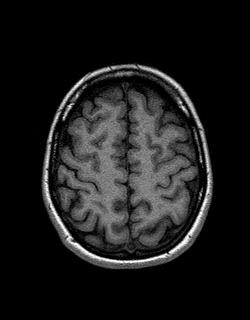
[im 129/144]
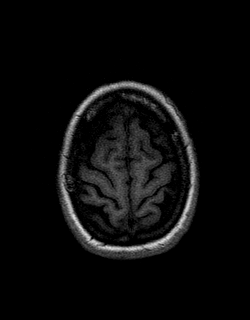
[im 144/144]
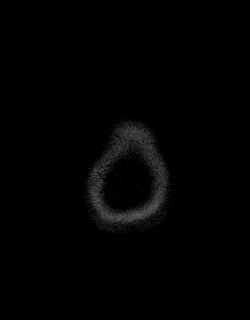

[Series 10: FLAIR · sagittal · 5.0mm · 0.45mm/px · 2 of 25 slices shown (2 of 2)]
[im 1/25]
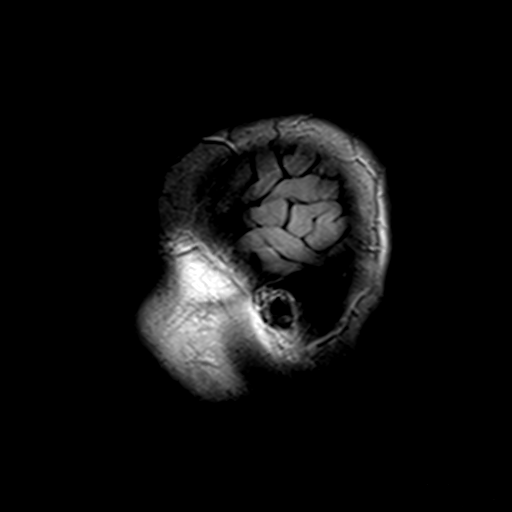
[im 25/25]
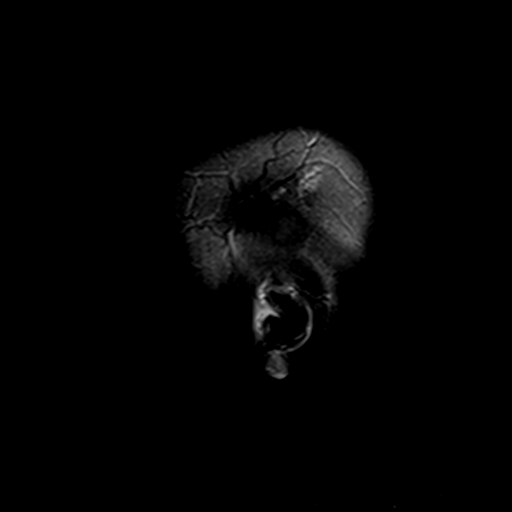

[Series 11: T2 · coronal · 5.0mm · 0.45mm/px · 2 of 25 slices shown (2 of 2)]
[im 1/25]
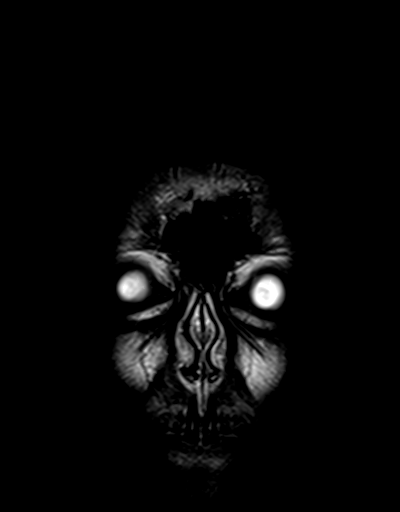
[im 25/25]
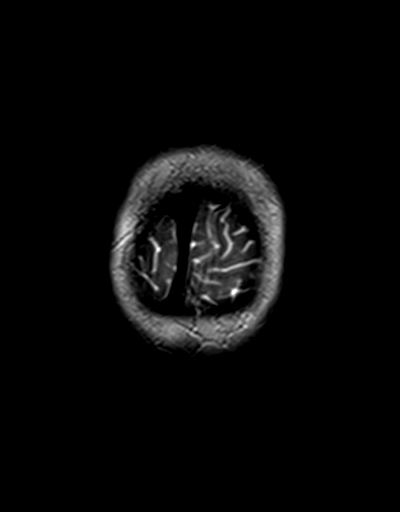

[Series 12: t1_mpr_tra · axial · 1.0mm · 0.75mm/px · z∈[-78,+65]mm · 11 of 144 slices shown (2 of 2)]
[im 1/144]
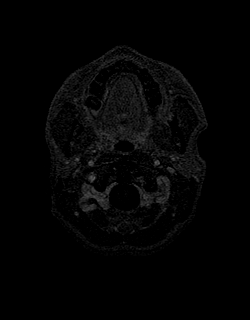
[im 15/144]
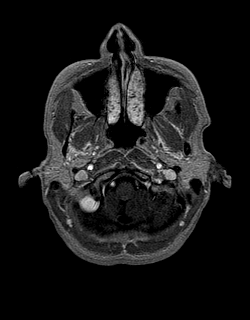
[im 29/144]
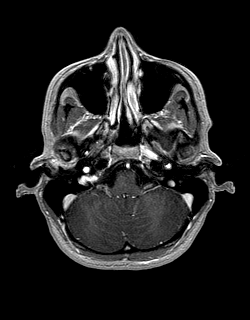
[im 43/144]
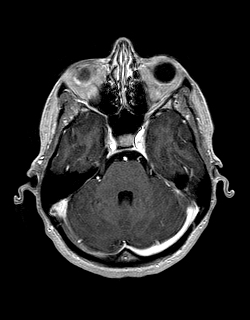
[im 58/144]
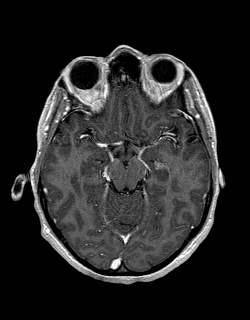
[im 72/144]
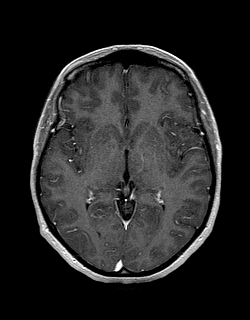
[im 86/144]
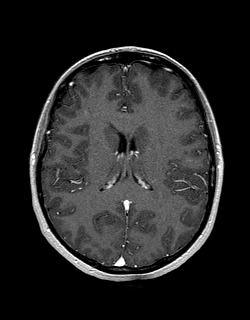
[im 101/144]
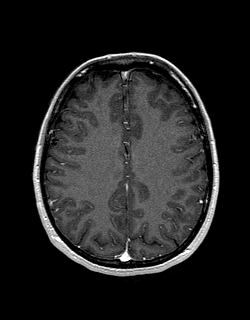
[im 115/144]
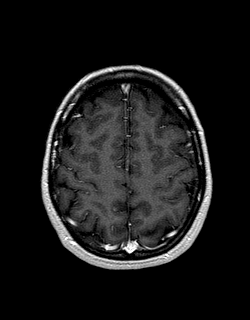
[im 129/144]
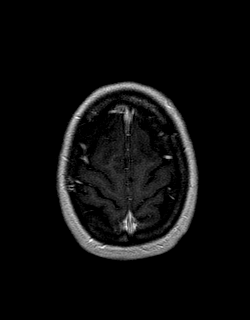
[im 144/144]
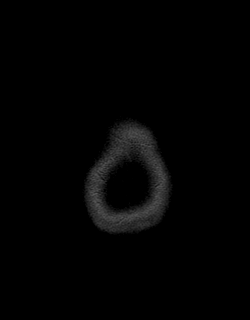

[Series 13: post cor · coronal · 5.0mm · 0.45mm/px · 2 of 25 slices shown]
[im 1/25]
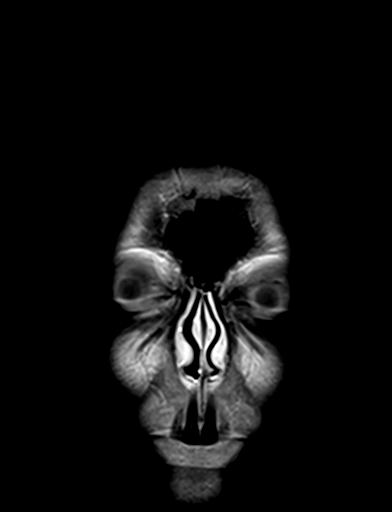
[im 25/25]
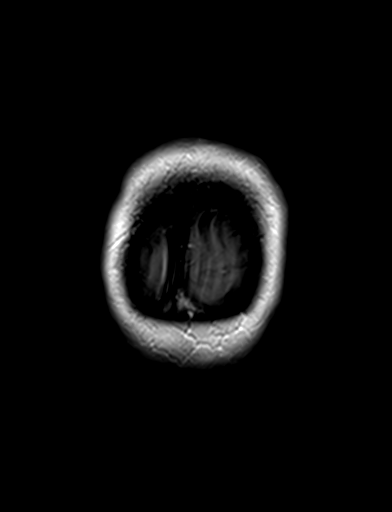

[48 of 48 positions shown; findings below may reference images not displayed]

FINDINGS: Brain: No restricted diffusion to suggest acute infarction. No
midline shift, mass effect, evidence of mass lesion,
ventriculomegaly, extra-axial collection or acute intracranial
hemorrhage. Cervicomedullary junction and pituitary are within
normal limits.

Normal cerebral volume. Normal for age gray and white matter signal
throughout the brain; a solitary small nonspecific left operculum
white matter T2 and FLAIR hyperintense focus (series 6, image 18) is
nonspecific but within normal limits for this age group. No chronic
cerebral blood products. No abnormal enhancement identified. No
dural thickening.

Vascular: Major intracranial vascular flow voids are preserved. The
right vertebral artery appears dominant. The major dural venous
sinuses are enhancing and appear to be patent.

Skull and upper cervical spine: Normal visible cervical spine.
Visualized bone marrow signal is within normal limits.

Sinuses/Orbits: Rightward gaze deviation but otherwise normal
orbits. Paranasal sinuses and mastoids are stable and well
pneumatized.

Other: Visible internal auditory structures appear normal. Negative
scalp and face soft tissues.
IMPRESSION: No acute intracranial abnormality.  Brain MRI is normal for age.

## 2020-11-06 ENCOUNTER — Telehealth: Payer: Self-pay | Admitting: Gastroenterology

## 2020-12-05 ENCOUNTER — Other Ambulatory Visit: Payer: Self-pay | Admitting: Gastroenterology
